# Patient Record
Sex: Male | Born: 1965 | Race: Black or African American | Hispanic: No | Marital: Married | Smoking: Never smoker
Health system: Southern US, Community
[De-identification: ages and names within clinical notes are randomized; demographics above are authoritative.]

## PROBLEM LIST (undated history)

## (undated) DIAGNOSIS — I4891 Unspecified atrial fibrillation: Secondary | ICD-10-CM

## (undated) DIAGNOSIS — M545 Low back pain, unspecified: Secondary | ICD-10-CM

## (undated) DIAGNOSIS — D573 Sickle-cell trait: Secondary | ICD-10-CM

## (undated) DIAGNOSIS — Z8619 Personal history of other infectious and parasitic diseases: Secondary | ICD-10-CM

## (undated) DIAGNOSIS — I499 Cardiac arrhythmia, unspecified: Secondary | ICD-10-CM

## (undated) DIAGNOSIS — R319 Hematuria, unspecified: Secondary | ICD-10-CM

## (undated) DIAGNOSIS — Z87442 Personal history of urinary calculi: Secondary | ICD-10-CM

## (undated) HISTORY — DX: Unspecified atrial fibrillation: I48.91

## (undated) HISTORY — DX: Low back pain: M54.5

## (undated) HISTORY — DX: Low back pain, unspecified: M54.50

## (undated) HISTORY — DX: Hematuria, unspecified: R31.9

## (undated) HISTORY — DX: Personal history of other infectious and parasitic diseases: Z86.19

---

## 1999-08-11 ENCOUNTER — Encounter: Admission: RE | Admit: 1999-08-11 | Discharge: 1999-08-11 | Payer: Self-pay | Admitting: Family Medicine

## 1999-08-11 ENCOUNTER — Encounter: Payer: Self-pay | Admitting: Family Medicine

## 2004-02-10 ENCOUNTER — Ambulatory Visit: Payer: Self-pay | Admitting: Family Medicine

## 2004-04-22 ENCOUNTER — Ambulatory Visit: Payer: Self-pay | Admitting: Internal Medicine

## 2004-06-15 ENCOUNTER — Ambulatory Visit: Payer: Self-pay | Admitting: Internal Medicine

## 2004-06-22 ENCOUNTER — Ambulatory Visit: Payer: Self-pay | Admitting: Internal Medicine

## 2004-07-22 ENCOUNTER — Ambulatory Visit: Payer: Self-pay | Admitting: Internal Medicine

## 2004-11-04 ENCOUNTER — Ambulatory Visit: Payer: Self-pay | Admitting: Internal Medicine

## 2005-09-07 ENCOUNTER — Ambulatory Visit: Payer: Self-pay | Admitting: Internal Medicine

## 2005-09-07 ENCOUNTER — Observation Stay (HOSPITAL_COMMUNITY): Admission: EM | Admit: 2005-09-07 | Discharge: 2005-09-08 | Payer: Self-pay | Admitting: Internal Medicine

## 2005-09-08 ENCOUNTER — Ambulatory Visit: Payer: Self-pay | Admitting: Internal Medicine

## 2005-09-15 ENCOUNTER — Ambulatory Visit: Payer: Self-pay | Admitting: Internal Medicine

## 2005-09-21 ENCOUNTER — Ambulatory Visit: Payer: Self-pay | Admitting: Internal Medicine

## 2005-11-24 ENCOUNTER — Ambulatory Visit: Payer: Self-pay | Admitting: Internal Medicine

## 2005-12-03 ENCOUNTER — Ambulatory Visit: Payer: Self-pay | Admitting: Internal Medicine

## 2006-04-25 ENCOUNTER — Ambulatory Visit: Payer: Self-pay | Admitting: Internal Medicine

## 2006-04-25 LAB — CONVERTED CEMR LAB
ALT: 70 units/L — ABNORMAL HIGH (ref 0–40)
AST: 48 units/L — ABNORMAL HIGH (ref 0–37)
Albumin: 3.8 g/dL (ref 3.5–5.2)
Alkaline Phosphatase: 78 units/L (ref 39–117)
BUN: 11 mg/dL (ref 6–23)
Basophils Absolute: 0 10*3/uL (ref 0.0–0.1)
Basophils Relative: 0.3 % (ref 0.0–1.0)
Bilirubin, Direct: 0.1 mg/dL (ref 0.0–0.3)
CO2: 29 meq/L (ref 19–32)
Calcium: 9.5 mg/dL (ref 8.4–10.5)
Chloride: 108 meq/L (ref 96–112)
Creatinine, Ser: 1.2 mg/dL (ref 0.4–1.5)
Eosinophils Absolute: 0 10*3/uL (ref 0.0–0.6)
Eosinophils Relative: 0.6 % (ref 0.0–5.0)
GFR calc Af Amer: 87 mL/min
GFR calc non Af Amer: 72 mL/min
Glucose, Bld: 94 mg/dL (ref 70–99)
HCT: 46 % (ref 39.0–52.0)
HCV Ab: NEGATIVE
Hemoglobin: 15.7 g/dL (ref 13.0–17.0)
Hep B C IgM: NEGATIVE
Hep B Core Total Ab: NEGATIVE
Hep B S Ab: NEGATIVE
Hepatitis B Surface Ag: NEGATIVE
Lymphocytes Relative: 18 % (ref 12.0–46.0)
MCHC: 34.2 g/dL (ref 30.0–36.0)
MCV: 81.5 fL (ref 78.0–100.0)
Monocytes Absolute: 0.6 10*3/uL (ref 0.2–0.7)
Monocytes Relative: 8.3 % (ref 3.0–11.0)
Neutro Abs: 5.6 10*3/uL (ref 1.4–7.7)
Neutrophils Relative %: 72.8 % (ref 43.0–77.0)
Platelets: 215 10*3/uL (ref 150–400)
Potassium: 4.1 meq/L (ref 3.5–5.1)
RBC: 5.64 M/uL (ref 4.22–5.81)
RDW: 13.3 % (ref 11.5–14.6)
Sodium: 144 meq/L (ref 135–145)
TSH: 1.54 microintl units/mL (ref 0.35–5.50)
Total Bilirubin: 0.7 mg/dL (ref 0.3–1.2)
Total Protein: 6.9 g/dL (ref 6.0–8.3)
WBC: 7.6 10*3/uL (ref 4.5–10.5)

## 2006-05-26 ENCOUNTER — Ambulatory Visit: Payer: Self-pay | Admitting: Internal Medicine

## 2006-12-05 DIAGNOSIS — R42 Dizziness and giddiness: Secondary | ICD-10-CM

## 2007-09-20 ENCOUNTER — Ambulatory Visit: Payer: Self-pay | Admitting: Internal Medicine

## 2007-09-20 LAB — CONVERTED CEMR LAB
ALT: 34 units/L (ref 0–53)
AST: 25 units/L (ref 0–37)
Albumin: 4.2 g/dL (ref 3.5–5.2)
Alkaline Phosphatase: 66 units/L (ref 39–117)
BUN: 13 mg/dL (ref 6–23)
Basophils Absolute: 0 10*3/uL (ref 0.0–0.1)
Basophils Relative: 0 % (ref 0–1)
CO2: 22 meq/L (ref 19–32)
Calcium: 9.5 mg/dL (ref 8.4–10.5)
Chloride: 106 meq/L (ref 96–112)
Cholesterol: 246 mg/dL — ABNORMAL HIGH (ref 0–200)
Creatinine, Ser: 1.15 mg/dL (ref 0.40–1.50)
Eosinophils Absolute: 0.1 10*3/uL (ref 0.0–0.7)
Eosinophils Relative: 1 % (ref 0–5)
Glucose, Bld: 90 mg/dL (ref 70–99)
HCT: 49.4 % (ref 39.0–52.0)
HDL: 56 mg/dL (ref >39)
Hemoglobin: 17.1 g/dL — ABNORMAL HIGH (ref 13.0–17.0)
LDL Cholesterol: 163 mg/dL — ABNORMAL HIGH (ref 0–99)
Lymphocytes Relative: 30 % (ref 12–46)
Lymphs Abs: 2.4 10*3/uL (ref 0.7–4.0)
MCHC: 34.6 g/dL (ref 30.0–36.0)
MCV: 78.3 fL (ref 78.0–100.0)
Monocytes Absolute: 0.7 10*3/uL (ref 0.1–1.0)
Monocytes Relative: 9 % (ref 3–12)
Neutro Abs: 5 10*3/uL (ref 1.7–7.7)
Neutrophils Relative %: 61 % (ref 43–77)
Platelets: 206 10*3/uL (ref 150–400)
Potassium: 4.1 meq/L (ref 3.5–5.3)
RBC: 6.31 M/uL — ABNORMAL HIGH (ref 4.22–5.81)
RDW: 14.7 % (ref 11.5–15.5)
Sodium: 141 meq/L (ref 135–145)
Total Bilirubin: 0.8 mg/dL (ref 0.3–1.2)
Total CHOL/HDL Ratio: 4.4
Total Protein: 7.2 g/dL (ref 6.0–8.3)
Triglycerides: 133 mg/dL (ref <150)
VLDL: 27 mg/dL (ref 0–40)
WBC: 8.2 10*3/uL (ref 4.0–10.5)

## 2007-09-23 ENCOUNTER — Ambulatory Visit: Payer: Self-pay | Admitting: Internal Medicine

## 2008-02-03 ENCOUNTER — Emergency Department (HOSPITAL_COMMUNITY): Admission: EM | Admit: 2008-02-03 | Discharge: 2008-02-04 | Payer: Self-pay | Admitting: Emergency Medicine

## 2010-07-22 ENCOUNTER — Encounter (INDEPENDENT_AMBULATORY_CARE_PROVIDER_SITE_OTHER): Payer: Self-pay | Admitting: Surgery

## 2010-07-24 NOTE — Discharge Summary (Signed)
NAMEOKEY, ZELEK NO.:  [id number]   MEDICAL RECORD NO.:  [id number]          PATIENT TYPE:  INP   LOCATION:  4736                         FACILITY:  [facility]   PHYSICIAN:  Gordy Savers, M.D. LHCDATE OF BIRTH:  [birth year]   DATE OF ADMISSION:  09/07/2005  DATE OF DISCHARGE:  09/08/2005                                 DISCHARGE SUMMARY   CHIEF COMPLAINT:  Chest pain.   HISTORY OF PRESENT ILLNESS:  The patient is aa 45 year old black gentleman  who presented to the office on the day of admission complaining of some  slight dyspnea and chest fullness.  An EKG was performed as well as an  initial set of cardiac enzymes.  The patient was sent home but later  readmitted when the total CK was 274.  CK-MB fraction was also slightly  elevated at 6.8.  D-dimer was negative.   The patient was subsequently admitted to the hospital for further  evaluation.  Followup EKG on the second hospital day revealed persistent  sinus bradycardia with inferior ST-T wave changes which were essentially  unchanged.  Serial cardiac markers were negative.   Over the brief hospital period of observation the patient remained  asymptomatic.  A chest x-ray was obtained that revealed some bronchitic  changes but no other acute disease.  At the time of discharge the patient  was pain free.  Chest was clear.  Cardiovascular exam was normal except for  some slight bradycardia.   During the hospital period, he was treated with Lovenox and placed on  aspirin.   DISPOSITION:  The patient will be discharged today on aspirin 325 mg daily.  He will be notified tomorrow and be set up for a nuclear medicine stress  test to exclude any coronary artery disease.  This seems low likelihood in a  patient without significant cardiac risk factors.  He will report any  recurrent chest pain.   CONDITION ON DISCHARGE:  Stable.           ______________________________  Gordy Savers, M.D.  L[facility]     PFK/MEDQ  D:  09/08/2005  T:  09/08/2005  Job:  [id number] [id number]

## 2010-07-24 NOTE — H&P (Signed)
Juan Mcintyre, Juan Mcintyre               ACCOUNT NO.:  [id number]   MEDICAL RECORD NO.:  [id number]          PATIENT TYPE:  EMS   LOCATION:  MAJO                         FACILITY:  [facility]   PHYSICIAN:  Juan Mole. Swords, MD    DATE OF BIRTH:  March 02, 1966   DATE OF ADMISSION:  09/07/2005  DATE OF DISCHARGE:                                HISTORY & PHYSICAL   CHIEF COMPLAINT:  Chest pain.   HISTORY OF PRESENT ILLNESS:  Mr. Juan Mcintyre is a 45 year old male who is  generally healthy.  He developed substernal chest discomfort associated with  a tight breathing sensation.  It lasted several hours, resolved  spontaneously, and then recurred today.  Symptoms now resolved.  He denies  any dyspnea on exertion, denies nausea or vomiting.  No diaphoresis.  He  describes his discomfort as a deep ache with intensity of 3/10. No cough and  no other complaints.  He denies that the discomfort was exertional, and he  has been able to exercise without symptoms.   PAST MEDICAL HISTORY:  Unremarkable.   CURRENT MEDICATIONS:  None.   SOCIAL HISTORY:  He works as a Curator.  He denies any recent unusual  activity, no travel.  He is married.  He is a nonsmoker.   FAMILY HISTORY:  Noncontributory.  Parents are alive and well without any  heart disease.  He has no cardiac risk factors.   REVIEW OF SYSTEMS:  He denies any constitutional, eye, ENT, mouth,  respiratory, GI, GU, musculoskeletal, skin, neurologic, psychiatric,  endocrine, hematology, lymphatics, allergy complaints other than those  stated above.  He specifically denies any heartburn.   PHYSICAL EXAMINATION:  VITAL SIGNS: Weight 198.  Temperature 97.5, pulse 64,  respirations 14, blood pressure 104/68.  GENERAL:  Well-developed, well-nourished male in no acute distress.  HEENT:  Atraumatic and normocephalic.  Extraocular muscles are intact.  NECK: Supple without lymphadenopathy, thyromegaly, jugular venous  distention.  CHEST: Clear to auscultation  without any increased work of breathing.  CARDIAC:  S1, S2 normal without murmurs, rubs, or gallops.  ABDOMEN: Active bowel sounds, soft, nontender, with no hepatosplenomegaly.  MUSCULOSKELETAL: Gait is normal.  He does have tenderness over the sternum  to palpation.  NEUROLOGIC:  Alert and oriented.   ASSESSMENT AND PLAN:  Chest pain, unclear etiology.   I suspect this is musculoskeletal chest pain given tenderness. His  description is somewhat different.  I will check an EKG which demonstrated  no acute findings.  We will check a D-dimer and CK with MB.  Of note, CK and  MB came back as minimally abnormal with total CK of 274 and MB of 6.8.   I think he needs admission based on that in the story.  He is admitted to  the hospital.  We will rule out myocardial infarction with serial enzymes,  perform and EKG and chest x-ray.  He may need outpatient stress testing,  perhaps Myoview.   We will treat with Lovenox and metoprolol empirically and add aspirin.      [name], MD  Electronically Signed  BHS/MEDQ  D:  09/07/2005  T:  09/07/2005  Job:  [id number]

## 2010-08-08 ENCOUNTER — Emergency Department (HOSPITAL_COMMUNITY): Payer: BC Managed Care – PPO

## 2010-08-08 ENCOUNTER — Emergency Department (HOSPITAL_COMMUNITY)
Admission: EM | Admit: 2010-08-08 | Discharge: 2010-08-08 | Disposition: A | Discharge status: Home / Self Care | Payer: BC Managed Care – PPO | Attending: Emergency Medicine | Admitting: Emergency Medicine

## 2010-08-08 DIAGNOSIS — I498 Other specified cardiac arrhythmias: Secondary | ICD-10-CM | POA: Insufficient documentation

## 2010-08-08 DIAGNOSIS — R0789 Other chest pain: Secondary | ICD-10-CM | POA: Insufficient documentation

## 2010-12-08 LAB — URINALYSIS, ROUTINE W REFLEX MICROSCOPIC
Bilirubin Urine: NEGATIVE
Glucose, UA: NEGATIVE
Hgb urine dipstick: NEGATIVE
Ketones, ur: NEGATIVE
Nitrite: NEGATIVE
Protein, ur: NEGATIVE
Specific Gravity, Urine: 1.024
Urobilinogen, UA: 0.2
pH: 5.5

## 2010-12-08 LAB — GLUCOSE, CAPILLARY

## 2010-12-08 LAB — GC/CHLAMYDIA PROBE AMP, URINE: Chlamydia, Swab/Urine, PCR: NEGATIVE

## 2010-12-08 LAB — URINE CULTURE: Culture: NO GROWTH

## 2011-02-20 ENCOUNTER — Emergency Department (HOSPITAL_COMMUNITY)
Admission: EM | Admit: 2011-02-20 | Discharge: 2011-02-20 | Disposition: A | Discharge status: Home / Self Care | Payer: Self-pay | Attending: Emergency Medicine | Admitting: Emergency Medicine

## 2011-02-20 ENCOUNTER — Encounter (HOSPITAL_COMMUNITY): Payer: Self-pay

## 2011-02-20 ENCOUNTER — Emergency Department (HOSPITAL_COMMUNITY): Payer: Self-pay

## 2011-02-20 DIAGNOSIS — R42 Dizziness and giddiness: Secondary | ICD-10-CM | POA: Insufficient documentation

## 2011-02-20 DIAGNOSIS — M129 Arthropathy, unspecified: Secondary | ICD-10-CM | POA: Insufficient documentation

## 2011-02-20 DIAGNOSIS — R110 Nausea: Secondary | ICD-10-CM | POA: Insufficient documentation

## 2011-02-20 DIAGNOSIS — Z7982 Long term (current) use of aspirin: Secondary | ICD-10-CM | POA: Insufficient documentation

## 2011-02-20 DIAGNOSIS — R51 Headache: Secondary | ICD-10-CM | POA: Insufficient documentation

## 2011-02-20 MED ORDER — MECLIZINE HCL 50 MG PO TABS: 50.0000 mg | ORAL_TABLET | Freq: Three times a day (TID) | ORAL | Status: AC | PRN

## 2011-02-20 MED ORDER — MECLIZINE HCL 25 MG PO TABS
25.0000 mg | ORAL_TABLET | Freq: Once | ORAL | Status: AC
  Administered 2011-02-20: 25 mg via ORAL
  Filled 2011-02-20: qty 1

## 2011-02-20 NOTE — ED Notes (Signed)
At 04:30 am pt.felt a pop in his head, and developed a headache, nausea, dizziness

## 2011-02-20 NOTE — ED Provider Notes (Signed)
Medical screening examination/treatment/procedure(s) were conducted as a shared visit with non-physician practitioner(s) and myself.  I personally evaluated the patient during the encounter.  Seen by me .  No neuro def.  Ct head neg  Donnetta Hutching, MD 02/20/11 [phone]

## 2011-02-20 NOTE — ED Provider Notes (Signed)
History     CSN: [id number] Arrival date & time: 02/20/2011  3:14 PM   First MD Initiated Contact with Patient 02/20/11 1553     Patient states while lying in bed at 4am he felt a sudden "pop" in his head associated with dizziness and nausea. He describes dizziness as his surroundings spinning. Symptoms are resolved at present. Patient states "I just don't feel right," but cannot describe further. Denies significant past medical hx. Denies hx of migraines or trauma. Denies constitutional sx.  States he takes ASA 81mg  daily for "thick blood."   Patient is a 45 y.o. male presenting with headaches. The history is provided by the patient.  Headache  This is a new problem. The current episode started 6 to 12 hours ago. The problem occurs every few hours. The problem has been resolved. The headache is associated with nothing. The patient is experiencing no pain. The pain does not radiate. Associated symptoms include nausea. Pertinent negatives include no anorexia, no fever, no malaise/fatigue, no near-syncope, no orthopnea, no palpitations, no syncope, no shortness of breath and no vomiting. He has tried nothing for the symptoms.    Past Medical History  Diagnosis Date  . Arthritis     History reviewed. No pertinent past surgical history.  History reviewed. No pertinent family history.  History  Substance Use Topics  . Smoking status: Never Smoker   . Smokeless tobacco: Not on file  . Alcohol Use: No      Review of Systems  Constitutional: Negative for fever, chills, malaise/fatigue, diaphoresis, activity change, appetite change and fatigue.  HENT: Negative for hearing loss, ear pain, congestion, facial swelling, rhinorrhea, sneezing, neck pain, neck stiffness, postnasal drip, tinnitus and ear discharge.   Eyes: Negative.   Respiratory: Negative for cough and shortness of breath.   Cardiovascular: Negative for chest pain, palpitations, orthopnea, leg swelling, syncope and  near-syncope.  Gastrointestinal: Positive for nausea. Negative for vomiting and anorexia.  Musculoskeletal: Negative for back pain and gait problem.  Neurological: Positive for dizziness. Negative for tremors, seizures, syncope, facial asymmetry, speech difficulty, weakness, light-headedness, numbness and headaches.  Hematological: Does not bruise/bleed easily.  Psychiatric/Behavioral: Negative.   All other systems reviewed and are negative.    Allergies  Review of patient's allergies indicates no known allergies.  Home Medications   Current Outpatient Rx  Name Route Sig Dispense Refill  . ADULT MULTIVITAMIN W/MINERALS CH Oral Take 1 tablet by mouth daily.        BP 136/87  Pulse 68  Temp(Src) 98.7 F (37.1 C) (Oral)  Resp 20  Ht 6\' 2"  (1.88 m)  Wt 220 lb (99.791 kg)  BMI 28.25 kg/m2  SpO2 98%  Physical Exam  Constitutional: He is oriented to person, place, and time. He appears well-developed and well-nourished. No distress.       Talking on cellphone in NAD  HENT:  Head: Normocephalic and atraumatic.  Right Ear: External ear normal.  Left Ear: External ear normal.  Nose: Nose normal.  Mouth/Throat: Oropharynx is clear and moist. No oropharyngeal exudate.       TM normal BL.  Eyes: Conjunctivae and EOM are normal. Pupils are equal, round, and reactive to light. Right eye exhibits no nystagmus. Left eye exhibits no nystagmus.  Neck: Normal range of motion. Neck supple.       No meningeal signs  Cardiovascular: Normal rate, regular rhythm, normal heart sounds and intact distal pulses.  Exam reveals no gallop and no friction rub.  No murmur heard. Pulmonary/Chest: Effort normal and breath sounds normal.  Abdominal: Soft. There is no tenderness.  Musculoskeletal: Normal range of motion. He exhibits no edema and no tenderness.  Neurological: He is alert and oriented to person, place, and time. He has normal strength and normal reflexes. He is not disoriented. He displays  no atrophy and no tremor. No cranial nerve deficit or sensory deficit. He exhibits normal muscle tone. He displays a negative Romberg sign. He displays no seizure activity. Coordination and gait normal. GCS eye subscore is 4. GCS verbal subscore is 5. GCS motor subscore is 6.       Negative HINTS exam  Skin: Skin is warm and dry. He is not diaphoretic.    ED Course  Procedures (including critical care time)  5:03 PM: Patient denies active symptoms on arrival to room - no HA, no dizziness or nausea at this time. Dizziness is described as movement of his surroundings. Neuro exam is intact. No trauma. No URI sx. No meningeal signs. Will d/w Dr. Adriana Simas. On review of records, patient has carried diagnosis of vertigo since at least 2008.  6:35 PM: Patient feels improved with meclizine. CT scan reassuring, repeat neuro exam reassuring. Doubt posterior circulation stroke. D/w Dr. Adriana Simas - d/c home with meclizine and PMD follow up. Patient agrees with plan.   Labs Reviewed - No data to display Ct Head Wo Contrast  02/20/2011  *RADIOLOGY REPORT*  Clinical Data: Head pain.  Pop in the head this morning.  Headache and dizziness.  CT HEAD WITHOUT CONTRAST  Technique:  Contiguous axial images were obtained from the base of the skull through the vertex without contrast.  Comparison: None.  Findings: There is no intra or extra-axial fluid collection or mass lesion.  The basilar cisterns and ventricles have a normal appearance.  There is no CT evidence for acute infarction or hemorrhage.  Bone windows show no acute finding.  IMPRESSION: Negative exam.  Original Report Authenticated By: Patterson Hammersmith, M.D.     No diagnosis found. Vertigo   MDM  Patient describes vertiginous symptoms. Reassuring neuro exam and imaging. Improved in dept. With meclizine. PMD follow up recommended. Patient verbalized understanding of reasons to return.      Marcell Anger, [place] 02/20/11 [phone]

## 2011-07-07 HISTORY — PX: OTHER SURGICAL HISTORY: SHX169

## 2012-08-02 ENCOUNTER — Encounter (HOSPITAL_COMMUNITY): Payer: Self-pay | Admitting: Emergency Medicine

## 2012-08-02 ENCOUNTER — Emergency Department (HOSPITAL_COMMUNITY): Payer: BC Managed Care – PPO

## 2012-08-02 ENCOUNTER — Emergency Department (HOSPITAL_COMMUNITY)
Admission: EM | Admit: 2012-08-02 | Discharge: 2012-08-03 | Disposition: A | Discharge status: Home / Self Care | Payer: BC Managed Care – PPO | Attending: Emergency Medicine | Admitting: Emergency Medicine

## 2012-08-02 DIAGNOSIS — Z7982 Long term (current) use of aspirin: Secondary | ICD-10-CM | POA: Insufficient documentation

## 2012-08-02 DIAGNOSIS — Z79899 Other long term (current) drug therapy: Secondary | ICD-10-CM | POA: Insufficient documentation

## 2012-08-02 DIAGNOSIS — R42 Dizziness and giddiness: Secondary | ICD-10-CM | POA: Insufficient documentation

## 2012-08-02 DIAGNOSIS — Z8739 Personal history of other diseases of the musculoskeletal system and connective tissue: Secondary | ICD-10-CM | POA: Insufficient documentation

## 2012-08-02 DIAGNOSIS — R0789 Other chest pain: Secondary | ICD-10-CM

## 2012-08-02 LAB — BASIC METABOLIC PANEL
Chloride: 104 mEq/L (ref 96–112)
GFR calc Af Amer: 85 mL/min — ABNORMAL LOW (ref >90)
Potassium: 3.7 mEq/L (ref 3.5–5.1)

## 2012-08-02 LAB — CBC
HCT: 47 % (ref 39.0–52.0)
Platelets: 207 10*3/uL (ref 150–400)
RBC: 6.23 MIL/uL — ABNORMAL HIGH (ref 4.22–5.81)
RDW: 14.1 % (ref 11.5–15.5)
WBC: 9.2 10*3/uL (ref 4.0–10.5)

## 2012-08-02 LAB — PRO B NATRIURETIC PEPTIDE: Pro B Natriuretic peptide (BNP): <5 pg/mL (ref 0–125)

## 2012-08-02 LAB — POCT I-STAT TROPONIN I

## 2012-08-02 MED ORDER — NITROGLYCERIN 0.4 MG SL SUBL: 0.4000 mg | SUBLINGUAL_TABLET | SUBLINGUAL | Status: DC | PRN

## 2012-08-02 MED ORDER — ASPIRIN 325 MG PO TABS: 325.0000 mg | ORAL_TABLET | ORAL | Status: DC

## 2012-08-02 MED ORDER — SODIUM CHLORIDE 0.9 % IV BOLUS (SEPSIS): 1000.0000 mL | Freq: Once | INTRAVENOUS | Status: DC

## 2012-08-02 NOTE — ED Notes (Signed)
PT. REPORTS INTERMITTENT LEFT CHEST PAIN ./ PALPITATIONS ONSET LAST NIGHT WITH SLIGHT SOB AND OCCASIONAL DRY COUGH , DENIES NAUSEA OR DIAPHORESIS , NO CHEST PAIN AT ARRIVAL.

## 2012-08-03 NOTE — ED Notes (Signed)
Pt states he refuses an IV and wants to drink water when he gets home. Pt told the significance of getting the IV. Pt states it takes too long and wants to go home

## 2012-08-03 NOTE — ED Provider Notes (Signed)
History     CSN: [id number]  Arrival date & time 08/02/12  2121   First MD Initiated Contact with Patient 08/02/12 2247      Chief Complaint  Patient presents with  . Chest Pain    (Consider location/radiation/quality/duration/timing/severity/associated sxs/prior treatment) HPI Patient presents emergency department with chest pain, dizziness, and weakness.  Patient, states, that he had chest pain, starting last night, and would last for 10 seconds at a time.  Patient denies shortness of breath, nausea, vomiting, diarrhea, abdominal pain, back pain, blurred vision, headache, syncope, or lightheadedness.  Patient, states, that he did not take anything prior to arrival for his symptoms.  Patient, states, that nothing seems to make his condition, better or worse.  Patient, states, that the  Weakness, and dizziness occurred while at work today.  Patient, states, that Past Medical History  Diagnosis Date  . Arthritis     History reviewed. No pertinent past surgical history.  No family history on file.  History  Substance Use Topics  . Smoking status: Never Smoker   . Smokeless tobacco: Not on file  . Alcohol Use: No      Review of Systems All other systems negative except as documented in the HPI. All pertinent positives and negatives as reviewed in the HPI. Allergies  Review of patient's allergies indicates no known allergies.  Home Medications   Current Outpatient Rx  Name  Route  Sig  Dispense  Refill  . aspirin 81 MG tablet   Oral   Take 81 mg by mouth daily.         . Multiple Vitamin (MULITIVITAMIN WITH MINERALS) TABS   Oral   Take 1 tablet by mouth daily.             BP 140/95  Pulse 64  Temp(Src) 98.9 F (37.2 C) (Oral)  Resp 21  SpO2 98%  Physical Exam  Nursing note and vitals reviewed. Constitutional: He is oriented to person, place, and time. He appears well-developed and well-nourished. No distress.  HENT:  Head: Normocephalic and atraumatic.   Mouth/Throat: Oropharynx is clear and moist.  Eyes: Pupils are equal, round, and reactive to light.  Neck: Normal range of motion. Neck supple.  Cardiovascular: Normal rate, regular rhythm and normal heart sounds.  Exam reveals no gallop and no friction rub.   No murmur heard. Pulmonary/Chest: Effort normal and breath sounds normal. No respiratory distress.  Abdominal: Soft. Bowel sounds are normal. He exhibits no distension. There is no tenderness.  Neurological: He is alert and oriented to person, place, and time.  Skin: Skin is warm and dry.    ED Course  Procedures (including critical care time)  Labs Reviewed  CBC - Abnormal; Notable for the following:    RBC 6.23 (*)    Hemoglobin 17.5 (*)    MCV 75.4 (*)    MCHC 37.2 (*)    All other components within normal limits  BASIC METABOLIC PANEL - Abnormal; Notable for the following:    Glucose, Bld 102 (*)    GFR calc non Af Amer 74 (*)    GFR calc Af Amer 85 (*)    All other components within normal limits  PRO B NATRIURETIC PEPTIDE  POCT I-STAT TROPONIN I   Dg Chest 2 View  08/02/2012   *RADIOLOGY REPORT*  Clinical Data: Chest pain and dizziness; mild hypertension.  CHEST - 2 VIEW  Comparison: Chest radiograph performed 08/08/2010  Findings: The lungs are well-aerated and clear.  There is no evidence of focal opacification, pleural effusion or pneumothorax.  The heart is normal in size; the mediastinal contour is within normal limits.  No acute osseous abnormalities are seen.  IMPRESSION: No acute cardiopulmonary process seen.   Original Report Authenticated By: Tonia Ghent, M.D.   Patient's chest pain, lasts for about 10 seconds at a time which is atypical for ACS.  Patient is PERC negative.I feel that the patient may have dehydration, and that's what causes, dizziness.  Patient does work out in the heat.patient states he like to go home at this time.  He is not having any current symptoms.  Patient is advised to return here  for any worsening in his condition.    MDM   MDM Reviewed: nursing note, vitals and previous chart Interpretation: labs, x-ray and ECG           Carlyle Dolly, PA-C 08/03/12 0049  Carlyle Dolly, PA-C 08/03/12 [phone]

## 2012-08-05 NOTE — ED Provider Notes (Signed)
Medical screening examination/treatment/procedure(s) were performed by non-physician practitioner and as supervising physician I was immediately available for consultation/collaboration.   Zurisadai Helminiak, MD 08/05/12 0018 

## 2013-06-27 ENCOUNTER — Other Ambulatory Visit: Payer: Self-pay | Admitting: Family Medicine

## 2013-06-27 DIAGNOSIS — R319 Hematuria, unspecified: Secondary | ICD-10-CM

## 2013-06-27 DIAGNOSIS — M549 Dorsalgia, unspecified: Secondary | ICD-10-CM

## 2013-07-03 ENCOUNTER — Ambulatory Visit
Admission: RE | Admit: 2013-07-03 | Discharge: 2013-07-03 | Disposition: A | Discharge status: Home / Self Care | Payer: BC Managed Care – PPO | Source: Ambulatory Visit | Attending: Family Medicine | Admitting: Family Medicine

## 2013-07-03 DIAGNOSIS — R319 Hematuria, unspecified: Secondary | ICD-10-CM

## 2013-07-03 DIAGNOSIS — M549 Dorsalgia, unspecified: Secondary | ICD-10-CM

## 2013-07-09 ENCOUNTER — Other Ambulatory Visit: Payer: Self-pay | Admitting: Family Medicine

## 2013-07-09 DIAGNOSIS — R918 Other nonspecific abnormal finding of lung field: Secondary | ICD-10-CM

## 2013-07-11 ENCOUNTER — Ambulatory Visit
Admission: RE | Admit: 2013-07-11 | Discharge: 2013-07-11 | Disposition: A | Discharge status: Home / Self Care | Payer: BC Managed Care – PPO | Source: Ambulatory Visit | Attending: Family Medicine | Admitting: Family Medicine

## 2013-07-11 DIAGNOSIS — R918 Other nonspecific abnormal finding of lung field: Secondary | ICD-10-CM

## 2013-07-13 ENCOUNTER — Other Ambulatory Visit: Payer: Self-pay | Admitting: Family Medicine

## 2013-07-13 DIAGNOSIS — R160 Hepatomegaly, not elsewhere classified: Secondary | ICD-10-CM

## 2013-07-25 ENCOUNTER — Other Ambulatory Visit: Payer: Self-pay | Admitting: Family Medicine

## 2013-07-25 DIAGNOSIS — R160 Hepatomegaly, not elsewhere classified: Secondary | ICD-10-CM

## 2013-08-02 ENCOUNTER — Ambulatory Visit
Admission: RE | Admit: 2013-08-02 | Discharge: 2013-08-02 | Disposition: A | Discharge status: Home / Self Care | Payer: BC Managed Care – PPO | Source: Ambulatory Visit | Attending: Family Medicine | Admitting: Family Medicine

## 2013-08-02 DIAGNOSIS — R160 Hepatomegaly, not elsewhere classified: Secondary | ICD-10-CM

## 2013-08-02 MED ORDER — GADOBENATE DIMEGLUMINE 529 MG/ML IV SOLN
19.0000 mL | Freq: Once | INTRAVENOUS | Status: AC | PRN
  Administered 2013-08-02: 19 mL via INTRAVENOUS

## 2014-06-17 ENCOUNTER — Other Ambulatory Visit: Payer: Self-pay | Admitting: Internal Medicine

## 2014-06-17 DIAGNOSIS — R911 Solitary pulmonary nodule: Secondary | ICD-10-CM

## 2014-06-28 ENCOUNTER — Ambulatory Visit
Admission: RE | Admit: 2014-06-28 | Discharge: 2014-06-28 | Disposition: A | Discharge status: Home / Self Care | Payer: 59 | Source: Ambulatory Visit | Attending: Internal Medicine | Admitting: Internal Medicine

## 2014-06-28 DIAGNOSIS — R911 Solitary pulmonary nodule: Secondary | ICD-10-CM

## 2014-12-06 ENCOUNTER — Emergency Department (HOSPITAL_COMMUNITY): Payer: 59 | Admitting: Anesthesiology

## 2014-12-06 ENCOUNTER — Encounter (HOSPITAL_COMMUNITY): Payer: Self-pay | Admitting: Emergency Medicine

## 2014-12-06 ENCOUNTER — Encounter (HOSPITAL_COMMUNITY)
Admission: EM | Disposition: A | Discharge status: Home / Self Care | Payer: Self-pay | Source: Home / Self Care | Attending: Emergency Medicine

## 2014-12-06 ENCOUNTER — Ambulatory Visit (HOSPITAL_COMMUNITY)
Admission: EM | Admit: 2014-12-06 | Discharge: 2014-12-07 | Disposition: A | Discharge status: Home / Self Care | Payer: 59 | Attending: Urology | Admitting: Urology

## 2014-12-06 ENCOUNTER — Ambulatory Visit: Admit: 2014-12-06 | Payer: Self-pay | Admitting: Urology

## 2014-12-06 DIAGNOSIS — Y9389 Activity, other specified: Secondary | ICD-10-CM | POA: Insufficient documentation

## 2014-12-06 DIAGNOSIS — I4891 Unspecified atrial fibrillation: Secondary | ICD-10-CM | POA: Insufficient documentation

## 2014-12-06 DIAGNOSIS — S39840A Fracture of corpus cavernosum penis, initial encounter: Secondary | ICD-10-CM | POA: Diagnosis not present

## 2014-12-06 DIAGNOSIS — N4889 Other specified disorders of penis: Secondary | ICD-10-CM | POA: Diagnosis present

## 2014-12-06 DIAGNOSIS — Y9289 Other specified places as the place of occurrence of the external cause: Secondary | ICD-10-CM | POA: Diagnosis not present

## 2014-12-06 DIAGNOSIS — X58XXXA Exposure to other specified factors, initial encounter: Secondary | ICD-10-CM | POA: Insufficient documentation

## 2014-12-06 DIAGNOSIS — D573 Sickle-cell trait: Secondary | ICD-10-CM | POA: Diagnosis not present

## 2014-12-06 DIAGNOSIS — Y998 Other external cause status: Secondary | ICD-10-CM | POA: Insufficient documentation

## 2014-12-06 HISTORY — PX: REPAIR OF FRACTURED PENIS: SHX6063

## 2014-12-06 HISTORY — DX: Sickle-cell trait: D57.3

## 2014-12-06 SURGERY — REPAIR, FRACTURE, PENIS
Anesthesia: General | Site: Penis

## 2014-12-06 MED ORDER — LIDOCAINE HCL (CARDIAC) 20 MG/ML IV SOLN
INTRAVENOUS | Status: DC | PRN
  Administered 2014-12-06: 50 mg via INTRAVENOUS

## 2014-12-06 MED ORDER — HYDROCODONE-ACETAMINOPHEN 5-325 MG PO TABS
1.0000 | ORAL_TABLET | ORAL | Status: DC | PRN
  Administered 2014-12-07: 2 via ORAL
  Filled 2014-12-06: qty 2

## 2014-12-06 MED ORDER — FLEET ENEMA 7-19 GM/118ML RE ENEM: 1.0000 | ENEMA | Freq: Once | RECTAL | Status: DC | PRN

## 2014-12-06 MED ORDER — HYDROMORPHONE HCL 1 MG/ML IJ SOLN
0.5000 mg | INTRAMUSCULAR | Status: DC | PRN
  Administered 2014-12-07: 1 mg via INTRAVENOUS
  Filled 2014-12-06 (×2): qty 1

## 2014-12-06 MED ORDER — METOCLOPRAMIDE HCL 5 MG/ML IJ SOLN
INTRAMUSCULAR | Status: DC | PRN
  Administered 2014-12-06: 10 mg via INTRAVENOUS

## 2014-12-06 MED ORDER — LACTATED RINGERS IV SOLN
INTRAVENOUS | Status: DC
  Administered 2014-12-06: 1000 mL via INTRAVENOUS

## 2014-12-06 MED ORDER — DOCUSATE SODIUM 100 MG PO CAPS
100.0000 mg | ORAL_CAPSULE | Freq: Two times a day (BID) | ORAL | Status: DC
  Administered 2014-12-06 – 2014-12-07 (×2): 100 mg via ORAL
  Filled 2014-12-06 (×2): qty 1

## 2014-12-06 MED ORDER — ACETAMINOPHEN 325 MG PO TABS: 650.0000 mg | ORAL_TABLET | ORAL | Status: DC | PRN

## 2014-12-06 MED ORDER — MIDAZOLAM HCL 2 MG/2ML IJ SOLN
INTRAMUSCULAR | Status: AC
  Filled 2014-12-06: qty 4

## 2014-12-06 MED ORDER — DIPHENHYDRAMINE HCL 50 MG/ML IJ SOLN: 12.5000 mg | Freq: Four times a day (QID) | INTRAMUSCULAR | Status: DC | PRN

## 2014-12-06 MED ORDER — BISACODYL 10 MG RE SUPP: 10.0000 mg | Freq: Every day | RECTAL | Status: DC | PRN

## 2014-12-06 MED ORDER — FENTANYL CITRATE (PF) 100 MCG/2ML IJ SOLN
INTRAMUSCULAR | Status: AC
  Filled 2014-12-06: qty 4

## 2014-12-06 MED ORDER — CEFAZOLIN SODIUM-DEXTROSE 2-3 GM-% IV SOLR
2.0000 g | Freq: Three times a day (TID) | INTRAVENOUS | Status: DC
  Administered 2014-12-06: 2 g via INTRAVENOUS
  Filled 2014-12-06 (×3): qty 50

## 2014-12-06 MED ORDER — PROPOFOL 10 MG/ML IV BOLUS
INTRAVENOUS | Status: DC | PRN
  Administered 2014-12-06: 200 mg via INTRAVENOUS

## 2014-12-06 MED ORDER — ONDANSETRON HCL 4 MG/2ML IJ SOLN: 4.0000 mg | INTRAMUSCULAR | Status: DC | PRN

## 2014-12-06 MED ORDER — 0.9 % SODIUM CHLORIDE (POUR BTL) OPTIME
TOPICAL | Status: DC | PRN
  Administered 2014-12-06: 1000 mL

## 2014-12-06 MED ORDER — HYDROMORPHONE HCL 2 MG/ML IJ SOLN
INTRAMUSCULAR | Status: AC
  Filled 2014-12-06: qty 1

## 2014-12-06 MED ORDER — MIDAZOLAM HCL 5 MG/5ML IJ SOLN
INTRAMUSCULAR | Status: DC | PRN
  Administered 2014-12-06: 2 mg via INTRAVENOUS

## 2014-12-06 MED ORDER — HYDROMORPHONE HCL 1 MG/ML IJ SOLN
INTRAMUSCULAR | Status: AC
  Administered 2014-12-07: 1 mg
  Filled 2014-12-06: qty 1

## 2014-12-06 MED ORDER — ONDANSETRON HCL 4 MG/2ML IJ SOLN
INTRAMUSCULAR | Status: DC | PRN
Start: 2014-12-06 — End: 2014-12-06
  Administered 2014-12-06: 4 mg via INTRAVENOUS

## 2014-12-06 MED ORDER — FENTANYL CITRATE (PF) 100 MCG/2ML IJ SOLN
INTRAMUSCULAR | Status: DC | PRN
  Administered 2014-12-06: 50 ug via INTRAVENOUS
  Administered 2014-12-06 (×6): 25 ug via INTRAVENOUS

## 2014-12-06 MED ORDER — SENNOSIDES-DOCUSATE SODIUM 8.6-50 MG PO TABS: 1.0000 | ORAL_TABLET | Freq: Every evening | ORAL | Status: DC | PRN

## 2014-12-06 MED ORDER — HYDROMORPHONE HCL 1 MG/ML IJ SOLN
INTRAMUSCULAR | Status: DC | PRN
  Administered 2014-12-06 (×2): 0.5 mg via INTRAVENOUS

## 2014-12-06 MED ORDER — PROPOFOL 10 MG/ML IV BOLUS
INTRAVENOUS | Status: AC
  Filled 2014-12-06: qty 20

## 2014-12-06 MED ORDER — DIPHENHYDRAMINE HCL 12.5 MG/5ML PO ELIX: 12.5000 mg | ORAL_SOLUTION | Freq: Four times a day (QID) | ORAL | Status: DC | PRN

## 2014-12-06 MED ORDER — HYDROMORPHONE HCL 1 MG/ML IJ SOLN
0.2500 mg | INTRAMUSCULAR | Status: DC | PRN
  Administered 2014-12-06: 0.5 mg via INTRAVENOUS

## 2014-12-06 MED ORDER — KCL IN DEXTROSE-NACL 20-5-0.45 MEQ/L-%-% IV SOLN
INTRAVENOUS | Status: DC
  Administered 2014-12-06: 22:00:00 via INTRAVENOUS
  Filled 2014-12-06 (×3): qty 1000

## 2014-12-06 MED ORDER — ZOLPIDEM TARTRATE 5 MG PO TABS: 5.0000 mg | ORAL_TABLET | Freq: Every evening | ORAL | Status: DC | PRN

## 2014-12-06 MED ORDER — HYOSCYAMINE SULFATE 0.125 MG SL SUBL
0.1250 mg | SUBLINGUAL_TABLET | SUBLINGUAL | Status: DC | PRN
  Filled 2014-12-06: qty 1

## 2014-12-06 SURGICAL SUPPLY — 40 items
BENZOIN TINCTURE PRP APPL 2/3 (GAUZE/BANDAGES/DRESSINGS) IMPLANT
BLADE HEX COATED 2.75 (ELECTRODE) IMPLANT
BLADE SURG 15 STRL LF DISP TIS (BLADE) ×1 IMPLANT
BLADE SURG 15 STRL SS (BLADE) ×2
BNDG GAUZE ELAST 4 BULKY (GAUZE/BANDAGES/DRESSINGS) ×3 IMPLANT
CATH SILICONE 16FRX5CC (CATHETERS) ×3 IMPLANT
CATH URET 5FR 28IN OPEN ENDED (CATHETERS) IMPLANT
COVER SURGICAL LIGHT HANDLE (MISCELLANEOUS) ×3 IMPLANT
DRAIN PENROSE 18X1/2 LTX STRL (DRAIN) IMPLANT
DRAIN PENROSE 18X1/4 LTX STRL (WOUND CARE) IMPLANT
DRAIN PENROSE LF 8X20.3CM SIL (WOUND CARE) ×3 IMPLANT
DRAPE PED LAPAROTOMY (DRAPES) ×3 IMPLANT
ELECT PENCIL ROCKER SW 15FT (MISCELLANEOUS) ×3 IMPLANT
ELECT REM PT RETURN 9FT ADLT (ELECTROSURGICAL) ×3
ELECTRODE REM PT RTRN 9FT ADLT (ELECTROSURGICAL) ×1 IMPLANT
GAUZE SPONGE 4X4 12PLY STRL (GAUZE/BANDAGES/DRESSINGS) ×3 IMPLANT
GLOVE SURG SS PI 8.0 STRL IVOR (GLOVE) IMPLANT
GOWN STRL REUS W/TWL XL LVL3 (GOWN DISPOSABLE) ×6 IMPLANT
KIT BASIN OR (CUSTOM PROCEDURE TRAY) ×3 IMPLANT
LIQUID BAND (GAUZE/BANDAGES/DRESSINGS) ×3 IMPLANT
NEEDLE HYPO 22GX1.5 SAFETY (NEEDLE) IMPLANT
NS IRRIG 1000ML POUR BTL (IV SOLUTION) ×3 IMPLANT
PACK BASIC VI WITH GOWN DISP (CUSTOM PROCEDURE TRAY) ×3 IMPLANT
PLUG CATH AND CAP STER (CATHETERS) ×3 IMPLANT
RETRACTOR WILSON SYSTEM (INSTRUMENTS) ×3 IMPLANT
SOL PREP PROV IODINE SCRUB 4OZ (MISCELLANEOUS) ×6 IMPLANT
SPONGE LAP 18X18 X RAY DECT (DISPOSABLE) ×3 IMPLANT
SPONGE LAP 4X18 X RAY DECT (DISPOSABLE) ×6 IMPLANT
SUPPORT SCROTAL LG STRP (MISCELLANEOUS) ×4 IMPLANT
SUPPORTER ATHLETIC LG (MISCELLANEOUS) ×2
SUT CHROMIC 3 0 SH 27 (SUTURE) ×6 IMPLANT
SUT CHROMIC 4 0 SH 27 (SUTURE) IMPLANT
SUT PDS AB 2-0 CT2 27 (SUTURE) ×6 IMPLANT
SUT VIC AB 3-0 SH 27 (SUTURE) ×4
SUT VIC AB 3-0 SH 27XBRD (SUTURE) ×2 IMPLANT
SUT VICRYL 0 TIES 12 18 (SUTURE) ×3 IMPLANT
SYR CONTROL 10ML LL (SYRINGE) ×6 IMPLANT
TRAY FOLEY BAG SILVER LF 16FR (SET/KITS/TRAYS/PACK) ×3 IMPLANT
TRAY FOLEY W/METER SILVER 16FR (SET/KITS/TRAYS/PACK) ×3 IMPLANT
WATER STERILE IRR 1500ML POUR (IV SOLUTION) IMPLANT

## 2014-12-06 NOTE — ED Notes (Signed)
Pt noted he started having swelling in his penis about an hour ago.  He stated he was masturbating and at the point of orgasm he noticed it twisted and had pain during ejaculation.  He went to his PCP first and they sent him here immediately for tx.

## 2014-12-06 NOTE — Discharge Instructions (Addendum)
Penile Fracture Fracture of the penis is an uncommon injury of the erect penis. This injury most often happens during forceful sexual intercourse, but it may happen under any circumstances when an erection occurs. This is not a fractured bone. The penis contains only soft and fibrous (tough leathery) tissue. The tough fibrous layers inside are the structures which fill up with blood during an erection (hardening of the penis). If a rupture or break of one of these layers occurs, it is called a penile fracture. There may also be injury to the urethra (the tube in the penis which carries the urine from the bladder). SYMPTOMS  This injury is usually noticed right away because of:  Pain.  A changed shape of the penis. DIAGNOSIS   The diagnosis can be made by your caregiver talking to you and learning how the injury happened.  The diagnosis can be made by examining you.  Specialized testing may sometimes be done to confirm a suspected diagnosis or to find out what damage needs to be repaired during surgery. These tests include:  Ultrasonography (imaging technique used to look inside the body).  Cavernosography (x-ray of the blood flow in the penis).  Urethrography (x-ray of the urethra). TREATMENT  When the problem is severe, it is considered a surgical emergency. The sooner the problem is repaired, the more likely there will be a good result. Emergency surgical repair offers the best chance of recovery with a correct working penis.  If conservative (nonsurgical) treatment is used, there may be other complications such as:  A bulging out of the side of the penis (aneurysm)  Scarring and hardening of the penis  Abnormal curvature  Erectile dysfunction (problems with impotence)  The final outcome will be unsatisfactory or poor. Minor cases of fractured penis may be treated conservatively. The decision to treat conservatively or with surgery should be made with your urologist to fully  understand the pros and cons of these different treatments. Document Released: 01/06/2004 Document Revised: 05/17/2011 Document Reviewed: 01/05/2008 [facility] Patient Information 2015 [place], [place]. This information is not intended to replace advice given to you by your health care provider. Make sure you discuss any questions you have with your health care provider.  Call for fever >101, difficulty with the catheter or heavy bleeding.    You can use an ice pack on the wound as needed.  Monday at 8am to have the catheter  removed.

## 2014-12-06 NOTE — Anesthesia Preprocedure Evaluation (Signed)
Anesthesia Evaluation  Patient identified by MRN, date of birth, ID band Patient awake    Reviewed: Allergy & Precautions, NPO status , Patient's Chart, lab work & pertinent test results  Airway Mallampati: II  TM Distance: >3 FB Neck ROM: Full    Dental no notable dental hx.    Pulmonary neg pulmonary ROS,    Pulmonary exam normal breath sounds clear to auscultation       Cardiovascular negative cardio ROS Normal cardiovascular exam Rhythm:Regular Rate:Normal     Neuro/Psych negative neurological ROS  negative psych ROS   GI/Hepatic negative GI ROS, Neg liver ROS,   Endo/Other  negative endocrine ROS  Renal/GU negative Renal ROS  negative genitourinary   Musculoskeletal negative musculoskeletal ROS (+)   Abdominal   Peds negative pediatric ROS (+)  Hematology negative hematology ROS (+)   Anesthesia Other Findings   Reproductive/Obstetrics negative OB ROS                             Anesthesia Physical Anesthesia Plan  ASA: I  Anesthesia Plan: General   Post-op Pain Management:    Induction: Intravenous  Airway Management Planned: LMA  Additional Equipment:   Intra-op Plan:   Post-operative Plan: Extubation in OR  Informed Consent: I have reviewed the patients History and Physical, chart, labs and discussed the procedure including the risks, benefits and alternatives for the proposed anesthesia with the patient or authorized representative who has indicated his/her understanding and acceptance.   Dental advisory given  Plan Discussed with: CRNA  Anesthesia Plan Comments:         Anesthesia Quick Evaluation  

## 2014-12-06 NOTE — Transfer of Care (Signed)
Immediate Anesthesia Transfer of Care Note  Patient: Juan Mcintyre  Procedure(s) Performed: Procedure(s): REPAIR OF FRACTURED PENIS (N/A)  Patient Location: PACU  Anesthesia Type:General  Level of Consciousness:  sedated, patient cooperative and responds to stimulation  Airway & Oxygen Therapy:Patient Spontanous Breathing and Patient connected to face mask oxgen  Post-op Assessment:  Report given to PACU RN and Post -op Vital signs reviewed and stable  Post vital signs:  Reviewed and stable  Last Vitals:  Filed Vitals:   12/06/14 1602  BP: 140/89  Pulse: 61  Temp:   Resp: 18    Complications: No apparent anesthesia complications

## 2014-12-06 NOTE — Anesthesia Postprocedure Evaluation (Signed)
  Anesthesia Post-op Note  Patient: Juan Mcintyre  Procedure(s) Performed: Procedure(s) (LRB): REPAIR OF FRACTURED PENIS (N/A)  Patient Location: PACU  Anesthesia Type: General  Level of Consciousness: awake and alert   Airway and Oxygen Therapy: Patient Spontanous Breathing  Post-op Pain: mild  Post-op Assessment: Post-op Vital signs reviewed, Patient's Cardiovascular Status Stable, Respiratory Function Stable, Patent Airway and No signs of Nausea or vomiting  Last Vitals:  Filed Vitals:   12/06/14 2200  BP: 134/85  Pulse: 48  Temp: 36.6 C  Resp: 18    Post-op Vital Signs: stable   Complications: No apparent anesthesia complications

## 2014-12-06 NOTE — ED Notes (Signed)
Clothing (shirt, underwear, socks, boots, pants, belt) sent with patient in bags to OR  Cellphone, contacts, phone charger, wallet (4 Visa, ID, misc cards, no cash), keys All given to security and logged on paper.

## 2014-12-06 NOTE — ED Provider Notes (Signed)
49 year old otherwise healthy male who while masturbating today felt acute onset of pain and deformity of his Penis.  He has not been able to urinate - has on exam a very swollen penile shaft, the foreskin is able to be retracted adequately, no urethral drainage discharge or blood, there is some bruising evident at the base of the penis. Scrotum otherwise nontender, bilateral testicles descended and palpable, no obvious masses in the scrotum other than diffuse scrotal swelling.  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  I personally evaluated the patient during the encounter.  Clinical Impression:   Final diagnoses:  Penile swelling         Eber Hong, MD 12/06/14 1539

## 2014-12-06 NOTE — ED Notes (Signed)
Pt voided 300 ml urine

## 2014-12-06 NOTE — H&P (Signed)
Subjective: Juan Mcintyre is a 49 yo male who I was asked to see in consultation by Dr. Hyacinth Meeker for a possible penile fracture.  He was masturbating this morning.  He torqued the penis to the left and felt a pop and lost the erection.  He than began to have massive swelling and pain.  He has had no hematuria and has been able to void.   He has no prior GU history.  ROS:  Review of Systems  Genitourinary:       See history.  All other systems reviewed and are negative.   No Known Allergies  Past Medical History  Diagnosis Date  . History of Helicobacter pylori infection   . Acute low back pain   . Hematuria   . A-fib   . Sickle cell trait     Past Surgical History  Procedure Laterality Date  . Other surgical history   07/2011    endo    Social History   Social History  . Marital Status: Married    Spouse Name: N/A  . Number of Children: N/A  . Years of Education: N/A   Occupational History  . Not on file.   Social History Main Topics  . Smoking status: Never Smoker   . Smokeless tobacco: Not on file  . Alcohol Use: No  . Drug Use: No  . Sexual Activity: Not on file   Other Topics Concern  . Not on file   Social History Narrative    Family History  Problem Relation Age of Onset  . Hypertension Mother   . Alzheimer's disease Father     Anti-infectives: Anti-infectives    None      No current facility-administered medications for this encounter.   No current outpatient prescriptions on file.     Objective: Vital signs in last 24 hours: Temp:  [99 F (37.2 C)] 99 F (37.2 C) (09/30 1026) Pulse Rate:  [93] 93 (09/30 1026) Resp:  [16] 16 (09/30 1026) BP: (147)/(98) 147/98 mmHg (09/30 1026) SpO2:  [97 %] 97 % (09/30 1026) Weight:  [97.977 kg (216 lb)] 97.977 kg (216 lb) (09/30 1026)  Intake/Output from previous day:   Intake/Output this shift:     Physical Exam  Constitutional: He is oriented to person, place, and time and well-developed,  well-nourished, and in no distress.  HENT:  Head: Normocephalic and atraumatic.  Neck: Normal range of motion. Neck supple.  Cardiovascular: Normal rate and regular rhythm.   Pulmonary/Chest: Effort normal and breath sounds normal.  Abdominal: Soft. Bowel sounds are normal. He exhibits no mass. There is no tenderness.  Genitourinary:  He has an uncircumcised penis with massive swelling.  There is no blood at the meatus.   There is palpable clot at the base of the penis.   Scrotum and contents are unremarkable.     Musculoskeletal: Normal range of motion. He exhibits no edema or tenderness.  Neurological: He is alert and oriented to person, place, and time.  Skin: Skin is warm and dry.  Psychiatric: Mood and affect normal.    Lab Results:  No results for input(s): WBC, HGB, HCT, PLT in the last 72 hours. BMET No results for input(s): NA, K, CL, CO2, GLUCOSE, BUN, CREATININE, CALCIUM in the last 72 hours. PT/INR No results for input(s): LABPROT, INR in the last 72 hours. ABG No results for input(s): PHART, HCO3 in the last 72 hours.  Invalid input(s): PCO2, PO2  Studies/Results: No results found.  Assessment: Penile fracture He has a probable penile fracture.  I am going to take him to the operating room today for repair of the fracture.   I have reviewed the risks of bleeding, infection, erectile dysfunction, penile curvature or chronic pain, Urethral injury, penile numbness, thrombotic events and anesthetic complications.      CC: Dr. Salvatore Marvel.      Iyona Pehrson J 12/06/2014 [phone]

## 2014-12-06 NOTE — Assessment & Plan Note (Addendum)
He is doing well following repair of the penile fracture involving the right proximal corpora and spongiosum.  Wound drain removed.   He will go home with the foley which will be removed on Monday.

## 2014-12-06 NOTE — ED Notes (Signed)
Pt urinal had . Completed bladder scanner and results >225ml. Pt is using urinal now.

## 2014-12-06 NOTE — ED Provider Notes (Signed)
CSN: [id number]     Arrival date & time 12/06/14  1018 History   First MD Initiated Contact with Patient 12/06/14 1026     Chief Complaint  Patient presents with  . Groin Swelling     HPI Comments: Juan Mcintyre is a 49 y.o. Male who presents with a swollen penis. Patient was masturbating, admitted to being "a bit rough," and when he was about to ejaculate, pt bent his penis, which produced intense pain, felt a "pop," and his penis immediately began to swell. Pt had painful ejaculation, but has not urinated yet. Pt denies any previous penile abnormalities, use of ED medications, or street drugs. Denies bloody discharge from urethra.   Past Medical History  Diagnosis Date  . History of Helicobacter pylori infection   . Acute low back pain   . Hematuria   . A-fib   . Sickle cell trait    Past Surgical History  Procedure Laterality Date  . Other surgical history   07/2011    endo   Family History  Problem Relation Age of Onset  . Hypertension Mother   . Alzheimer's disease Father    Social History  Substance Use Topics  . Smoking status: Never Smoker   . Smokeless tobacco: None  . Alcohol Use: No    Review of Systems  Genitourinary: Positive for penile swelling and scrotal swelling. Negative for testicular pain.      Allergies  Review of patient's allergies indicates no known allergies.  Home Medications   Prior to Admission medications   Medication Sig Start Date End Date Taking? Authorizing Provider  docusate sodium (COLACE) 100 MG capsule Take 1 capsule (100 mg total) by mouth 2 (two) times daily. 12/07/14   Bjorn Pippin, MD  HYDROcodone-acetaminophen (NORCO/VICODIN) 5-325 MG tablet Take 1-2 tablets by mouth every 4 (four) hours as needed for moderate pain. 12/07/14   Bjorn Pippin, MD   BP 120/83 mmHg  Pulse 47  Temp(Src) 98.4 F (36.9 C) (Oral)  Resp 18  Ht  (1.753 m)  Wt 216 lb (97.977 kg)  BMI 31.88 kg/m2  SpO2 100% Physical Exam  Constitutional: He  is oriented to person, place, and time. He appears well-developed and well-nourished. No distress.  HENT:  Head: Normocephalic and atraumatic.  Eyes: Conjunctivae and EOM are normal. Pupils are equal, round, and reactive to light.  Cardiovascular: Normal rate, regular rhythm and normal heart sounds.   Pulmonary/Chest: Effort normal and breath sounds normal. No respiratory distress.  Abdominal: Soft. Bowel sounds are normal.  Genitourinary: Right testis shows swelling. Right testis shows no tenderness. Left testis shows swelling. Left testis shows no tenderness. Uncircumcised. No discharge found.  Diffuse, massive penile shaft swelling. No bleeding or discharge noted.  Musculoskeletal: He exhibits no edema or tenderness.  Neurological: He is alert and oriented to person, place, and time.  Skin: Skin is warm and dry. He is not diaphoretic.  Nursing note and vitals reviewed.   ED Course  Procedures (including critical care time) Labs Review Labs Reviewed - No data to display  Imaging Review No results found. I have personally reviewed and evaluated these images and lab results as part of my medical decision-making.   EKG Interpretation None      MDM   Final diagnoses:  Penile swelling    Juan Mcintyre presents with a swollen penis.  Findings and plan of care discussed with Dr. Hyacinth Meeker.   11:00AM Urology paged by unit secretary  Patient informed  that he would need to urinate as soon as possible or a foley catheter would have to be placed. Patient given water to drink.  Patient urinated about 200cc. Bladder scan revealed empty bladder. No gross blood noted.  12:13 PM Spoke on phone with Dr. Annabell Howells, Urology. The consultant will come see the patient in the ED.  Appreciate Urology input, plan of care discussed with patient and patient agrees to plan of care. Urology determined patient to be surgical. Pt to be admitted.  Anselm Pancoast, PA-C 12/07/14 1610  Eber Hong,  MD 12/10/14 [phone]

## 2014-12-06 NOTE — ED Notes (Signed)
Instructed by co-worker of Wrenn MD to hold the antibiotic and send to OR with patient.

## 2014-12-06 NOTE — Brief Op Note (Signed)
12/06/2014  6:39 PM  PATIENT:  Juan Mcintyre  49 y.o. male  PRE-OPERATIVE DIAGNOSIS:  penile fracture  POST-OPERATIVE DIAGNOSIS:  Penile fracture involvine right corpora and corpora spongiosum.  PROCEDURE:  Procedure(s): REPAIR OF FRACTURED PENIS (N/A)  SURGEON:  Surgeon(s) and Role:    * Bjorn Pippin, MD - Primary  PHYSICIAN ASSISTANT:   ASSISTANTS: none   ANESTHESIA:   general  EBL:  Total I/O In: 500 [I.V.:500] Out: 50 [Blood:50]  BLOOD ADMINISTERED:none  DRAINS: Penrose drain in the scrotum and Urinary Catheter (Foley)   LOCAL MEDICATIONS USED:  NONE  SPECIMEN:  No Specimen  DISPOSITION OF SPECIMEN:  N/A  COUNTS:  YES  TOURNIQUET:  * No tourniquets in log *  DICTATION: .Other Dictation: Dictation Number [id number]  PLAN OF CARE: Admit for overnight observation  PATIENT DISPOSITION:  PACU - hemodynamically stable.   Delay start of Pharmacological VTE agent (>24hrs) due to surgical blood loss or risk of bleeding: not applicable

## 2014-12-06 NOTE — ED Notes (Signed)
Bed: WA02 Expected date:  Expected time:  Means of arrival:  Comments: 

## 2014-12-06 NOTE — ED Notes (Signed)
Pt given 6 wipes for OR cleansing

## 2014-12-06 NOTE — Anesthesia Procedure Notes (Signed)
Procedure Name: LMA Insertion Date/Time: 12/06/2014 5:39 PM Performed by: Paris Lore Pre-anesthesia Checklist: Patient identified, Emergency Drugs available, Suction available, Patient being monitored and Timeout performed Patient Re-evaluated:Patient Re-evaluated prior to inductionOxygen Delivery Method: Circle system utilized Preoxygenation: Pre-oxygenation with 100% oxygen Intubation Type: IV induction Ventilation: Mask ventilation without difficulty LMA: LMA inserted LMA Size: 4.0 Number of attempts: 1 Placement Confirmation: positive ETCO2 and breath sounds checked- equal and bilateral Tube secured with: Tape

## 2014-12-07 ENCOUNTER — Encounter (HOSPITAL_COMMUNITY): Payer: Self-pay | Admitting: Emergency Medicine

## 2014-12-07 ENCOUNTER — Emergency Department (HOSPITAL_COMMUNITY)
Admission: EM | Admit: 2014-12-07 | Discharge: 2014-12-08 | Disposition: A | Discharge status: Home / Self Care | Payer: 59 | Source: Home / Self Care | Attending: Emergency Medicine | Admitting: Emergency Medicine

## 2014-12-07 DIAGNOSIS — Z8619 Personal history of other infectious and parasitic diseases: Secondary | ICD-10-CM

## 2014-12-07 DIAGNOSIS — T839XXA Unspecified complication of genitourinary prosthetic device, implant and graft, initial encounter: Secondary | ICD-10-CM

## 2014-12-07 DIAGNOSIS — Y846 Urinary catheterization as the cause of abnormal reaction of the patient, or of later complication, without mention of misadventure at the time of the procedure: Secondary | ICD-10-CM

## 2014-12-07 DIAGNOSIS — Z8679 Personal history of other diseases of the circulatory system: Secondary | ICD-10-CM

## 2014-12-07 DIAGNOSIS — R339 Retention of urine, unspecified: Secondary | ICD-10-CM

## 2014-12-07 DIAGNOSIS — Z862 Personal history of diseases of the blood and blood-forming organs and certain disorders involving the immune mechanism: Secondary | ICD-10-CM

## 2014-12-07 DIAGNOSIS — T83038A Leakage of other indwelling urethral catheter, initial encounter: Secondary | ICD-10-CM

## 2014-12-07 MED ORDER — DOCUSATE SODIUM 100 MG PO CAPS: 100.0000 mg | ORAL_CAPSULE | Freq: Two times a day (BID) | ORAL | Status: DC

## 2014-12-07 MED ORDER — HYDROCODONE-ACETAMINOPHEN 5-325 MG PO TABS: 1.0000 | ORAL_TABLET | ORAL | Status: DC | PRN

## 2014-12-07 NOTE — ED Notes (Signed)
Pt discharged today with Foley cath after penile surgery. Pt complains of an obstruction that is blocking the flow of urine into the bag that is causing pain. Pt states  Penis enlarges when he urinates but does not come through catheter.

## 2014-12-07 NOTE — Discharge Summary (Signed)
  Physician Discharge Summary  Patient ID: Juan Mcintyre MRN: [id number] DOB/AGE: [birth year] 47 y.o.  Admit date: 12/06/2014 Discharge date: 12/07/2014  Admission Diagnoses:  Penile fracture  Discharge Diagnoses:  Principal Problem:   Penile fracture   Past Medical History  Diagnosis Date  . History of Helicobacter pylori infection   . Acute low back pain   . Hematuria   . A-fib   . Sickle cell trait     Surgeries: Procedure(s): REPAIR OF FRACTURED PENIS on 12/06/2014   Consultants (if any): Treatment Team:  Bjorn Pippin, MD  Discharged Condition: Improved  Hospital Course: OLVIN ROHR is an 49 y.o. male who was admitted 12/06/2014 with a diagnosis of Penile fracture and went to the operating room on 12/06/2014 and underwent the above named procedures.  The wound drain was removed this morning.  His urine  Is clear.   He was given perioperative antibiotics:  Anti-infectives    Start     Dose/Rate Route Frequency Ordered Stop   12/06/14 1400  ceFAZolin (ANCEF) IVPB 2 g/50 mL premix  Status:  Discontinued     2 g 100 mL/hr over 30 Minutes Intravenous 3 times per day 12/06/14 1243 12/06/14 2021    .  He was given sequential compression devices  for DVT prophylaxis.  He benefited maximally from the hospital stay and there were no complications.    Recent vital signs:  Filed Vitals:   12/07/14 0452  BP: 120/83  Pulse: 47  Temp: 98.4 F (36.9 C)  Resp: 18    Recent laboratory studies:  Lab Results  Component Value Date   HGB 17.5* 08/02/2012   HGB 17.1* 09/20/2007   HGB 15.7 04/25/2006   Lab Results  Component Value Date   WBC 9.2 08/02/2012   PLT 207 08/02/2012   No results found for: INR Lab Results  Component Value Date   NA 142 08/02/2012   K 3.7 08/02/2012   CL 104 08/02/2012   CO2 24 08/02/2012   BUN 14 08/02/2012   CREATININE 1.18 08/02/2012   GLUCOSE 102* 08/02/2012    Discharge Medications:     Medication List    TAKE these  medications        docusate sodium 100 MG capsule  Commonly known as:  COLACE  Take 1 capsule (100 mg total) by mouth 2 (two) times daily.     HYDROcodone-acetaminophen 5-325 MG tablet  Commonly known as:  NORCO/VICODIN  Take 1-2 tablets by mouth every 4 (four) hours as needed for moderate pain.        Diagnostic Studies: No results found.  Disposition: 01-Home or Self Care   Penile fracture He is doing well following repair of the penile fracture involving the right proximal corpora and spongiosum.  Wound drain removed.   He will go home with the foley which will be removed on Monday.         Follow-up Information    Follow up with Anner Crete, MD.   Specialty:  Urology   Why:  come to the office Monday at 8 am for removal of the catheter and drain.    Contact information:   [address] [place] [address] [phone]        Signed: Anner Crete 12/07/2014, 7:48 AM

## 2014-12-07 NOTE — Op Note (Signed)
NAMEAHSAN, Juan Mcintyre NO.:  [id number]  MEDICAL RECORD NO.:  [id number]  LOCATION:  1418                         FACILITY:  [facility]  PHYSICIAN:  Excell Seltzer. Annabell Howells, M.D.    DATE OF BIRTH:  April 06, 1965  DATE OF PROCEDURE:  12/06/2014 DATE OF DISCHARGE:                              OPERATIVE REPORT   PROCEDURE:  Repair of penile fracture.  PREOPERATIVE DIAGNOSIS:  Penile fracture.  POSTOPERATIVE DIAGNOSIS:  Penile fracture, with involvement of the right proximal corpora and urethral spongiosum.  SURGEON:  Excell Seltzer. Annabell Howells, M.D.  ANESTHESIA:  General.  BLOOD LOSS:  Approximately 50 mL.  DRAINS:  A 16-French Foley catheter and quarter-inch Penrose drain.  SPECIMEN:  None.  COMPLICATIONS:  None.  INDICATIONS:  Mr. Chrismer is a 49 year old African American male, who noted during masturbation this morning, a sudden pop at the base the penis with prompt deep tumescence of the penis followed by rapid swelling.  He was initially seen by his PCP, referred him to the emergency room, where I saw him earlier today.  He had the classic findings on history and exam of the penile fracture, palpable in the subcutaneous tissue at the base of the penis.  He had voided clear urine and was not felt to have injury to the urethra at least not into the lumen of the urethra.  He was given a fully informed consent, including the risks of bleeding, infection, penile deformity, persistent erectile dysfunction, penile pain, penile anesthesia, thrombotic events and anesthetic complications, and elected to proceed to surgical repair.  FINDINGS OF PROCEDURE:  He was taken to the operating room, where he was given 2 g of Ancef and was fitted with PAS hose.  General anesthetic was induced, with him in the supine position.  His genitalia were clipped and he was prepped with Betadine solution and draped in usual sterile fashion.  Due to the proximal location of the palpable clot at the base  the penis and the upper scrotum.  A midline incision was made ventrally over the penoscrotal junction with a knife.  This was carried through the dartos with the Bovie and with finger dissection.  I was able to spread the dartos down to the corpora, where a large clot was evacuated.  I promptly identified a transverse tear in the right corpora that extended actually into the urethral spongiosum, but not through the mucosa, based on palpation and lack of bleeding.  The tear was approximately 2 to 2.5 cm in length and extended to the lateral corpora.  Once the lesion was identified, the corporal tear was repaired using a running 2-0 PDS.  The spongiosal repair was closed with a running 3-0 Vicryl.  Once both components of the injury were repaired and hemostasis was secured.  Additional clot and blood was evacuated and a latex-free Penrose drain was placed through a separate stab wound in the right inferior scrotum.  This was placed over the area of the repair.  The dartos was closed in 2 layers with a running 3-0 chromic suture.  The skin was closed with a running vertical mattress, 3-0 chromic.  The wound was cleansed, dried, and Dermabond was applied.  The drain was secured with a 3-0 chromic.  A dressing of 4x4s, fluff, Kerlix, and a scrotal support was applied.  A 16-French silastic Foley was placed at the beginning of the procedure.  This had been used to aid evaluation of the urethral injury and determine that there was no mucosal component, but it was felt best that this should be left indwelling and was connected to straight drainage.  His anesthetic was then reversed and he was moved to recovery room in stable condition.  He will be kept overnight in the hospital and discharged home.  I will leave his catheter in through the weekend, and have him return to the office on Monday for removal.     Excell Seltzer. Annabell Howells, M.D.     JJW/MEDQ  D:  12/06/2014  T:  12/07/2014  Job:   [id number]

## 2014-12-08 NOTE — ED Notes (Signed)
Bed: WTR7 Expected date:  Expected time:  Means of arrival:  Comments: 

## 2014-12-08 NOTE — Discharge Instructions (Signed)
Monitor catheter for proper urine flow.  Also monitor for blood in urine. Follow-up with Dr. Annabell Howells on Monday as scheduled. Return here for new/worsening symptoms.

## 2014-12-08 NOTE — ED Provider Notes (Signed)
CSN: [id number]     Arrival date & time 12/07/14  2333 History   First MD Initiated Contact with Patient 12/08/14 0021     Chief Complaint  Patient presents with  . Urinary Retention  . Post-op Problem     (Consider location/radiation/quality/duration/timing/severity/associated sxs/prior Treatment) The history is provided by the patient and medical records.    This is a 49 year old male with history of A. fib, sickle cell trait, hematuria, presenting to the ED for problems with his Foley catheter. 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 Annabell Howells.  States today he noticed that he was having some "leaking" from his penis around the tubing of his foley catheter and he had increased pain, however no urine was collecting in leg pain.  He states he feels there is a "blockage" in the tube.  He denies fever, chills, sweats, abdominal pain, testicle pain, or any other related symptoms.  States he is scheduled to FU with Dr. Annabell Howells in 2 days to have catheter removed.  VSS.  Past Medical History  Diagnosis Date  . History of Helicobacter pylori infection   . Acute low back pain   . Hematuria   . A-fib (HCC)   . Sickle cell trait [facility])    Past Surgical History  Procedure Laterality Date  . Other surgical history   07/2011    endo   Family History  Problem Relation Age of Onset  . Hypertension Mother   . Alzheimer's disease Father    Social History  Substance Use Topics  . Smoking status: Never Smoker   . Smokeless tobacco: None  . Alcohol Use: No    Review of Systems  Genitourinary:       Foley catheter problem  All other systems reviewed and are negative.     Allergies  Review of patient's allergies indicates no known allergies.  Home Medications   Prior to Admission medications   Medication Sig Start Date End Date Taking? Authorizing Provider  docusate sodium (COLACE) 100 MG capsule Take 1 capsule (100  mg total) by mouth 2 (two) times daily. 12/07/14   Bjorn Pippin, MD  HYDROcodone-acetaminophen (NORCO/VICODIN) 5-325 MG tablet Take 1-2 tablets by mouth every 4 (four) hours as needed for moderate pain. 12/07/14   Bjorn Pippin, MD   BP 150/94 mmHg  Pulse 80  Temp(Src) 99.3 F (37.4 C) (Oral)  Resp 16  SpO2 96%   Physical Exam  Constitutional: He is oriented to person, place, and time. He appears well-developed and well-nourished. No distress.  HENT:  Head: Normocephalic and atraumatic.  Mouth/Throat: Oropharynx is clear and moist.  Eyes: Conjunctivae and EOM are normal. Pupils are equal, round, and reactive to light.  Neck: Normal range of motion. Neck supple.  Cardiovascular: Normal rate, regular rhythm and normal heart sounds.   Pulmonary/Chest: Effort normal and breath sounds normal. No respiratory distress. He has no wheezes.  Abdominal: Soft. Bowel sounds are normal. There is no tenderness. There is no guarding.  Genitourinary: Testes normal and penis normal. Uncircumcised. No penile erythema or penile tenderness. No discharge found.  Foley catheter in place without signs of skin infection; clear urine is easily flowing into leg bag  Musculoskeletal: Normal range of motion. He exhibits no edema.  Neurological: He is alert and oriented to person, place, and time.  Skin: Skin is warm and dry. He is not diaphoretic.  Psychiatric: He has a normal mood and  affect.  Nursing note and vitals reviewed.   ED Course  Procedures (including critical care time) Labs Review Labs Reviewed - No data to display  Imaging Review No results found.   EKG Interpretation None      MDM   Final diagnoses:  Foley catheter problem, initial encounter [facility])   49 y.o. M here with foley catheter problem.  Patient had surgery yesterday with Dr. Annabell Howells due to rupture of corpus cavernosum.  Patient afebrile, non-toxic.  Foley catheter was adjusted and irrigated prior to my exam.  There is clear urine  easily flowing into the leg bag.  Patient reports relief of pain/pressure sensation.  No further complaints at this time.  Patient stable for discharge.  He has follow-up with Dr. Annabell Howells tomorrow.  Discussed plan with patient, he/she acknowledged understanding and agreed with plan of care.  Return precautions given for new or worsening symptoms.  Garlon Hatchet, PA-C 12/08/14 0038  Lorre Nick, MD 12/10/14 [phone]

## 2014-12-08 NOTE — ED Notes (Signed)
Patient stated his penis is leaking and hurts when urine starts to stream into the foley bag. Nurse manipulated the foley and flushed the foley. Patient is currently draining urine without pain.

## 2014-12-09 ENCOUNTER — Encounter (HOSPITAL_COMMUNITY): Payer: Self-pay | Admitting: Urology

## 2015-05-03 ENCOUNTER — Encounter (HOSPITAL_COMMUNITY): Payer: Self-pay | Admitting: Oncology

## 2015-05-03 ENCOUNTER — Emergency Department (HOSPITAL_COMMUNITY)
Admission: EM | Admit: 2015-05-03 | Discharge: 2015-05-04 | Disposition: A | Discharge status: Home / Self Care | Payer: Self-pay | Attending: Emergency Medicine | Admitting: Emergency Medicine

## 2015-05-03 DIAGNOSIS — Z8619 Personal history of other infectious and parasitic diseases: Secondary | ICD-10-CM | POA: Insufficient documentation

## 2015-05-03 DIAGNOSIS — Z79899 Other long term (current) drug therapy: Secondary | ICD-10-CM | POA: Insufficient documentation

## 2015-05-03 DIAGNOSIS — R002 Palpitations: Secondary | ICD-10-CM | POA: Insufficient documentation

## 2015-05-03 DIAGNOSIS — I10 Essential (primary) hypertension: Secondary | ICD-10-CM

## 2015-05-03 DIAGNOSIS — Z862 Personal history of diseases of the blood and blood-forming organs and certain disorders involving the immune mechanism: Secondary | ICD-10-CM | POA: Insufficient documentation

## 2015-05-03 NOTE — ED Notes (Signed)
Pt presents d/t HTN x 1 day.  Pt states he took one of his friends BP medications however does not know what it was.  BP at home was 151/10.  In triage pt's BP is 140/80 manually.

## 2015-05-04 NOTE — Discharge Instructions (Signed)
Hypertension Hypertension, commonly called high blood pressure, is when the force of blood pumping through your arteries is too strong. Your arteries are the blood vessels that carry blood from your heart throughout your body. A blood pressure reading consists of a higher number over a lower number, such as 110/72. The higher number (systolic) is the pressure inside your arteries when your heart pumps. The lower number (diastolic) is the pressure inside your arteries when your heart relaxes. Ideally you want your blood pressure below 120/80. Hypertension forces your heart to work harder to pump blood. Your arteries may become narrow or stiff. Having untreated or uncontrolled hypertension can cause heart attack, stroke, kidney disease, and other problems. RISK FACTORS Some risk factors for high blood pressure are controllable. Others are not.  Risk factors you cannot control include:   Race. You may be at higher risk if you are African American.  Age. Risk increases with age.  Gender. Men are at higher risk than women before age 45 years. After age 65, women are at higher risk than men. Risk factors you can control include:  Not getting enough exercise or physical activity.  Being overweight.  Getting too much fat, sugar, calories, or salt in your diet.  Drinking too much alcohol. SIGNS AND SYMPTOMS Hypertension does not usually cause signs or symptoms. Extremely high blood pressure (hypertensive crisis) may cause headache, anxiety, shortness of breath, and nosebleed. DIAGNOSIS To check if you have hypertension, your health care provider will measure your blood pressure while you are seated, with your arm held at the level of your heart. It should be measured at least twice using the same arm. Certain conditions can cause a difference in blood pressure between your right and left arms. A blood pressure reading that is higher than normal on one occasion does not mean that you need treatment. If  it is not clear whether you have high blood pressure, you may be asked to return on a different day to have your blood pressure checked again. Or, you may be asked to monitor your blood pressure at home for 1 or more weeks. TREATMENT Treating high blood pressure includes making lifestyle changes and possibly taking medicine. Living a healthy lifestyle can help lower high blood pressure. You may need to change some of your habits. Lifestyle changes may include:  Following the DASH diet. This diet is high in fruits, vegetables, and whole grains. It is low in salt, red meat, and added sugars.  Keep your sodium intake below 2,300 mg per day.  Getting at least 30-45 minutes of aerobic exercise at least 4 times per week.  Losing weight if necessary.  Not smoking.  Limiting alcoholic beverages.  Learning ways to reduce stress. Your health care provider may prescribe medicine if lifestyle changes are not enough to get your blood pressure under control, and if one of the following is true:  You are 18-59 years of age and your systolic blood pressure is above 140.  You are 60 years of age or older, and your systolic blood pressure is above 150.  Your diastolic blood pressure is above 90.  You have diabetes, and your systolic blood pressure is over 140 or your diastolic blood pressure is over 90.  You have kidney disease and your blood pressure is above 140/90.  You have heart disease and your blood pressure is above 140/90. Your personal target blood pressure may vary depending on your medical conditions, your age, and other factors. HOME CARE INSTRUCTIONS    Have your blood pressure rechecked as directed by your health care provider.   Take medicines only as directed by your health care provider. Follow the directions carefully. Blood pressure medicines must be taken as prescribed. The medicine does not work as well when you skip doses. Skipping doses also puts you at risk for  problems.  Do not smoke.   Monitor your blood pressure at home as directed by your health care provider. SEEK MEDICAL CARE IF:   You think you are having a reaction to medicines taken.  You have recurrent headaches or feel dizzy.  You have swelling in your ankles.  You have trouble with your vision. SEEK IMMEDIATE MEDICAL CARE IF:  You develop a severe headache or confusion.  You have unusual weakness, numbness, or feel faint.  You have severe chest or abdominal pain.  You vomit repeatedly.  You have trouble breathing. MAKE SURE YOU:   Understand these instructions.  Will watch your condition.  Will get help right away if you are not doing well or get worse.   This information is not intended to replace advice given to you by your health care provider. Make sure you discuss any questions you have with your health care provider.   Document Released: 02/22/2005 Document Revised: 07/09/2014 Document Reviewed: 12/15/2012 Elsevier Interactive Patient Education 2016 [employer]  

## 2015-05-04 NOTE — ED Provider Notes (Signed)
CSN: [id number]     Arrival date & time 05/03/15  2249 History   First MD Initiated Contact with Patient 05/04/15 0005     Chief Complaint  Patient presents with  . Hypertension     (Consider location/radiation/quality/duration/timing/severity/associated sxs/prior Treatment) HPI Comments: Patient is a 50 year old male with a history of a child fibrillation and sickle cell trait who presents to the ED for evaluation of hypertension 1 day. Patient states that he has a history of high blood pressure, but has been off of this medication since increasing his daily exercise. Patient states, however, that he has not been to the gym in 2-3 months. He states that he took his blood pressure in the morning this morning and it was 180s over 110. He states that he usually runs "normal" at 120/80. Patient states that he began to feel dizzy and nauseous with palpitations when at work today. He took his blood pressure and it was 151/108. At this time, patient took half a tablet of one of his friend's blood pressure medications. He reports that he had an old bottle of his blood pressure medicine at work with him to confirm that he was taking the same medication that he used to be on. He feels as though this helped his symptoms. He has no complaints of symptoms at this time.  PCP - Dr. Nicholos Johns  Patient is a 50 y.o. male presenting with hypertension. The history is provided by the patient. No language interpreter was used.  Hypertension Associated symptoms include headaches and nausea. Pertinent negatives include no chest pain, fever, numbness, vomiting or weakness.    Past Medical History  Diagnosis Date  . History of Helicobacter pylori infection   . Acute low back pain   . Hematuria   . A-fib (HCC)   . Sickle cell trait [facility])    Past Surgical History  Procedure Laterality Date  . Other surgical history   07/2011    endo  . Repair of fractured penis N/A 12/06/2014    Procedure: REPAIR OF  FRACTURED PENIS;  Surgeon: Bjorn Pippin, MD;  Location: [facility] ORS;  Service: Urology;  Laterality: N/A;   Family History  Problem Relation Age of Onset  . Hypertension Mother   . Alzheimer's disease Father    Social History  Substance Use Topics  . Smoking status: Never Smoker   . Smokeless tobacco: None  . Alcohol Use: No    Review of Systems  Constitutional: Negative for fever.  Eyes:       +"eyes get glassy" No vision loss  Respiratory: Negative for shortness of breath.   Cardiovascular: Positive for palpitations. Negative for chest pain.  Gastrointestinal: Positive for nausea. Negative for vomiting.  Neurological: Positive for dizziness and headaches. Negative for weakness and numbness.  All other systems reviewed and are negative.   Allergies  Review of patient's allergies indicates no known allergies.  Home Medications   Prior to Admission medications   Medication Sig Start Date End Date Taking? Authorizing Provider  docusate sodium (COLACE) 100 MG capsule Take 1 capsule (100 mg total) by mouth 2 (two) times daily. 12/07/14   Bjorn Pippin, MD  HYDROcodone-acetaminophen (NORCO/VICODIN) 5-325 MG tablet Take 1-2 tablets by mouth every 4 (four) hours as needed for moderate pain. 12/07/14   Bjorn Pippin, MD   BP 140/80 mmHg  Pulse 69  Temp(Src) 98.3 F (36.8 C) (Oral)  Resp 18  Ht 6' (1.829 m)  Wt 97.523 kg  BMI 29.15 kg/m2  SpO2  99%   Physical Exam  Constitutional: He is oriented to person, place, and time. He appears well-developed and well-nourished. No distress.  Nontoxic/nonseptic appearing  HENT:  Head: Normocephalic and atraumatic.  Eyes: Conjunctivae and EOM are normal. No scleral icterus.  Neck: Normal range of motion.  No JVD  Cardiovascular: Normal rate, regular rhythm and intact distal pulses.   Pulmonary/Chest: Effort normal and breath sounds normal. No respiratory distress. He has no wheezes. He has no rales.  Respirations even and unlabored. Lungs clear.   Musculoskeletal: Normal range of motion. He exhibits no edema.  Neurological: He is alert and oriented to person, place, and time. He exhibits normal muscle tone. Coordination normal.  Ambulatory with steady gait.  Skin: Skin is warm and dry. No rash noted. He is not diaphoretic. No erythema. No pallor.  Psychiatric: He has a normal mood and affect. His behavior is normal.  Nursing note and vitals reviewed.   ED Course  Procedures (including critical care time) Labs Review Labs Reviewed - No data to display  Imaging Review No results found.   I have personally reviewed and evaluated these images and lab results as part of my medical decision-making.   EKG Interpretation   Date/Time:  Sunday May 04 2015 00:41:09 EST Ventricular Rate:  59 PR Interval:  172 QRS Duration: 110 QT Interval:  409 QTC Calculation: 405 R Axis:   47 Text Interpretation:  Sinus rhythm Borderline repolarization abnormality  No significant change since last tracing Confirmed by HORTON  MD, Toni Amend  ([id number]) on 05/04/2015 12:45:57 AM      MDM   Final diagnoses:  Essential hypertension    50 year old male sent to the ED for symptomatic hypertension. He is asymptomatic at this time and blood pressure is fairly well controlled. EKG shows no new changes or evidence of STEMI. Physical exam is reassuring. Patient reports previously being on blood pressure medications, but he does not know what medication he was on. Unable to obtain this info from prior Epic charts. Given reassuring blood pressure this time, I believe the patient is stable for outpatient follow-up with his primary care doctor for recheck of his blood pressure and to further discuss restarting anti-hypertensives. Return precautions discussed and provided. Also discussed exercise and dietary changes. Patient agreeable to plan with no unaddressed concerns; discharged in good condition.   Filed Vitals:   05/03/15 2304 05/03/15 2316  BP:  141/96 140/80  Pulse: 69   Temp: 98.3 F (36.8 C)   TempSrc: Oral   Resp: 18   Height: 6' (1.829 m)   Weight: 97.523 kg   SpO2: 99%      Antony Madura, PA-C 05/04/15 9811  Shon Baton, MD 05/04/15 2307

## 2015-08-18 ENCOUNTER — Other Ambulatory Visit: Payer: Self-pay | Admitting: Internal Medicine

## 2015-08-18 ENCOUNTER — Ambulatory Visit
Admission: RE | Admit: 2015-08-18 | Discharge: 2015-08-18 | Disposition: A | Discharge status: Home / Self Care | Payer: BLUE CROSS/BLUE SHIELD | Source: Ambulatory Visit | Attending: Internal Medicine | Admitting: Internal Medicine

## 2015-08-18 DIAGNOSIS — R221 Localized swelling, mass and lump, neck: Secondary | ICD-10-CM

## 2015-08-18 MED ORDER — IOPAMIDOL (ISOVUE-300) INJECTION 61%
75.0000 mL | Freq: Once | INTRAVENOUS | Status: AC | PRN
  Administered 2015-08-18: 75 mL via INTRAVENOUS

## 2015-08-29 ENCOUNTER — Emergency Department (HOSPITAL_COMMUNITY)
Admission: EM | Admit: 2015-08-29 | Discharge: 2015-08-30 | Disposition: A | Discharge status: Home / Self Care | Payer: BLUE CROSS/BLUE SHIELD | Attending: Emergency Medicine | Admitting: Emergency Medicine

## 2015-08-29 ENCOUNTER — Encounter (HOSPITAL_COMMUNITY): Payer: Self-pay | Admitting: Emergency Medicine

## 2015-08-29 DIAGNOSIS — I4891 Unspecified atrial fibrillation: Secondary | ICD-10-CM | POA: Insufficient documentation

## 2015-08-29 DIAGNOSIS — Z7982 Long term (current) use of aspirin: Secondary | ICD-10-CM | POA: Insufficient documentation

## 2015-08-29 DIAGNOSIS — R51 Headache: Secondary | ICD-10-CM | POA: Diagnosis not present

## 2015-08-29 DIAGNOSIS — Z79899 Other long term (current) drug therapy: Secondary | ICD-10-CM | POA: Insufficient documentation

## 2015-08-29 DIAGNOSIS — Z711 Person with feared health complaint in whom no diagnosis is made: Secondary | ICD-10-CM

## 2015-08-29 LAB — I-STAT CHEM 8, ED
BUN: 21 mg/dL — ABNORMAL HIGH (ref 6–20)
CALCIUM ION: 1.21 mmol/L (ref 1.12–1.23)
CHLORIDE: 105 mmol/L (ref 101–111)
Creatinine, Ser: 1.2 mg/dL (ref 0.61–1.24)
Glucose, Bld: 154 mg/dL — ABNORMAL HIGH (ref 65–99)
HCT: 50 % (ref 39.0–52.0)
HEMOGLOBIN: 17 g/dL (ref 13.0–17.0)
Potassium: 3.5 mmol/L (ref 3.5–5.1)
SODIUM: 140 mmol/L (ref 135–145)
TCO2: 23 mmol/L (ref 0–100)

## 2015-08-29 LAB — PROTIME-INR
INR: 0.99 (ref 0.00–1.49)
PROTHROMBIN TIME: 13.3 s (ref 11.6–15.2)

## 2015-08-29 NOTE — ED Notes (Signed)
Pt c/o "warm feeling" from top of L side of head radiating down to abdomen, pt states feeling lasts @10seconds , episode Tuesday then again today. Pt had MRI for swollen area to L posterior neck 2 days ago. Pt denies any neuro s/s with episodes.

## 2015-08-30 ENCOUNTER — Encounter (HOSPITAL_COMMUNITY): Payer: Self-pay | Admitting: Emergency Medicine

## 2015-08-30 ENCOUNTER — Emergency Department (HOSPITAL_COMMUNITY): Payer: BLUE CROSS/BLUE SHIELD

## 2015-08-30 LAB — CBC
HEMATOCRIT: 44.4 % (ref 39.0–52.0)
Hemoglobin: 16.4 g/dL (ref 13.0–17.0)
MCH: 27.5 pg (ref 26.0–34.0)
MCHC: 36.9 g/dL — AB (ref 30.0–36.0)
MCV: 74.4 fL — AB (ref 78.0–100.0)
PLATELETS: 209 10*3/uL (ref 150–400)
RBC: 5.97 MIL/uL — ABNORMAL HIGH (ref 4.22–5.81)
RDW: 14.1 % (ref 11.5–15.5)
WBC: 7.9 10*3/uL (ref 4.0–10.5)

## 2015-08-30 NOTE — ED Provider Notes (Signed)
CSN: [id number]     Arrival date & time 08/29/15  2250 History  By signing my name below, I, [facility], attest that this documentation has been prepared under the direction and in the presence of Barry Culverhouse, MD. Electronically Signed: Randell Patient, ED Scribe. 08/30/2015. 3:26 AM.    Chief Complaint  Patient presents with  . Headache    Patient is a 50 y.o. male presenting with general illness. The history is provided by the patient. No language interpreter was used.  Illness Location:  Left side of the upper body Quality:  [place]th Severity:  Mild Onset quality:  Sudden Duration: 2 episodes lasting < 10 seconds. 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 male with a hx of sick cell trait and A-Fib who presents to the Emergency Department complaining of one episode of paresthesia on the left side of his body from that occurred yesterday afternoon. Pt states that he was sitting down in a chair when he felt a warm sensation from his left-side abdomen that radiated to the left-side of his face which lasted for 10 seconds before resolving. 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 He has a follow-up appointment on 09/15/15 for this condition with an ENT but is unable to recall the name of this provider. Denies taking any medications regularly. Denies fever, vomiting, congestion, sneezing, rashes, abdominal pain, visual changes, eye itching,  tinnitus, changes in hearing or any other symptoms currently.  Past Medical History  Diagnosis Date  . History of Helicobacter pylori infection   . Acute low back pain   . Hematuria   . A-fib (HCC)   . Sickle cell trait [facility])    Past Surgical History  Procedure Laterality Date  . Other surgical history   07/2011    endo  . Repair of fractured penis N/A 12/06/2014    Procedure: REPAIR OF FRACTURED PENIS;  Surgeon: Bjorn Pippin, MD;  Location: [facility]S;  Service: Urology;  Laterality: N/A;   Family History  Problem Relation Age of Onset  . Hypertension Mother   . Alzheimer's disease Father    Social History  Substance Use Topics  . Smoking status: Never Smoker   . Smokeless tobacco: None  . Alcohol Use: No    Review of Systems  Constitutional: Negative for fever and fatigue.  HENT: Positive for sneezing. Negative for congestion, ear pain, hearing loss, sore throat and tinnitus.   Eyes: Negative for itching and visual disturbance.  Respiratory: Negative for cough, shortness of breath and wheezing.   Cardiovascular: Negative for chest pain.  Gastrointestinal: Negative for vomiting and abdominal pain.  Skin: Negative for rash.  Neurological: Negative for dizziness, tremors, seizures, loss of consciousness, syncope, facial asymmetry, speech difficulty, weakness, light-headedness, numbness and headaches.  All other systems reviewed and are negative.     Allergies  Review of patient's allergies indicates no known allergies.  Home Medications  Prior to Admission medications   Medication Sig Start Date End Date Taking? Authorizing Provider  amLODipine (NORVASC) 2.5 MG tablet Take 2.5 mg by mouth daily. 07/04/15  Yes Historical Provider, MD  aspirin EC 325 MG tablet Take 325 mg by mouth daily.   Yes Historical Provider, MD  docusate sodium (COLACE) 100 MG capsule Take 1 capsule (100 mg total) by mouth 2 (two) times daily. Patient not taking: Reported on 08/30/2015 12/07/14   Bjorn PippinJohn  Wrenn, MD  HYDROcodone-acetaminophen (NORCO/VICODIN) 5-325 MG tablet Take 1-2 tablets by mouth every 4 (four) hours as needed for moderate pain. Patient not taking: Reported on 08/30/2015 12/07/14   Bjorn PippinJohn Wrenn, MD   BP 134/94 mmHg  Pulse 59  Temp(Src) 97.6 F (36.4 C) (Oral)  Resp 20  Ht 6' (1.829 m)  Wt 216 lb (97.977 kg)  BMI 29.29 kg/m2  SpO2 96% Physical Exam  Constitutional: He is oriented to person, place, and time. He appears well-developed and well-nourished. No distress.  HENT:  Head: Normocephalic and atraumatic.  Mouth/Throat: Oropharynx is clear and moist and mucous membranes are normal. No oropharyngeal exudate.  Moist mucus membranes. No exudate.  Eyes: EOM are normal. Pupils are equal, round, and reactive to light. Right conjunctiva is injected. Left conjunctiva is injected.  Injected conjunctiva bilaterally. PERRL  Neck: Normal range of motion.  Cardiovascular: Normal rate, regular rhythm and intact distal pulses.   Regular rate and rhythm.  Pulmonary/Chest: Effort normal and breath sounds normal. No respiratory distress.  Lungs CTA bilaterally.  Abdominal: Soft. He exhibits no distension. There is no tenderness. There is no rebound and no guarding.  Abdomen soft.  Musculoskeletal: Normal range of motion.  Neurological: He is alert and oriented to person, place, and time. He has normal reflexes. He displays no atrophy, no tremor and normal reflexes. No cranial nerve deficit or sensory deficit. He exhibits normal muscle tone. He displays no seizure activity. Coordination and gait normal. GCS eye subscore is 4. GCS verbal subscore is 5. GCS motor subscore is 6.  Reflex Scores:      Tricep reflexes are 2+ on the right side and 2+ on the left side.      Bicep reflexes are 2+ on the right side and 2+ on the left side.      Brachioradialis reflexes are 2+ on the right side and 2+ on the left side.      Patellar reflexes are 2+ on the right side and 2+ on the left side.       Achilles reflexes are 2+ on the right side and 2+ on the left side. Cranial nerves II-XII intact. 5/5 strength in BUE and BLE.  Skin: Skin is warm and dry. He is not diaphoretic.  Psychiatric: He has a normal mood and affect. His behavior is normal.  Nursing note and vitals reviewed.   ED Course  Procedures (including critical care time)  DIAGNOSTIC STUDIES: Oxygen Saturation is 99% on RA, normal by my interpretation.    COORDINATION OF CARE: 2:47 AM Will consult with Radiology. Discussed treatment plan with pt at bedside and pt agreed to plan.     Labs Review Labs Reviewed  CBC - Abnormal; Notable for the following:    RBC 5.97 (*)    MCV 74.4 (*)    MCHC 36.9 (*)    All other components within normal limits  I-STAT CHEM 8, ED - Abnormal; Notable for the following:    BUN 21 (*)    Glucose, Bld  154 (*)    All other components within normal limits  PROTIME-INR    Imaging Review No results found. I have personally reviewed and evaluated these images and lab results as part of my medical decision-making.   EKG Interpretation None      MDM   Final diagnoses:  None    3:08 AM Consulted with radiologist Dr. Cherly Hensenhang who stated that the pt does not need a CT angio of the head & neck and advised that the usual stroke work-up protocol be followed because the AVM is 8 mm and the feeding vessels less than 5 mm which puts that pt at low risk for a blood vessel breaking off.   Will order head CT and consult with Neurology.  506 am case d/w neurology.  Patient does not require MRI nor MRI.  This is not a stroke and is not caused by small AVm.  Does need to see surgery but does not require further neuro work up   Patient has stated he has an appointment with the surgeon on the 10th.     Explained to patient what an AVM is ans that this is not a clot ans he has not had a stroke.   Follow up with surgery as directed, strict return precautions given.  Patient verbalizes  understanding and agrees to follow up   I personally performed the services described in this documentation, which was scribed in my presence. The recorded information has been reviewed and is accurate.     Cy BlamerApril Yazmine Sorey, MD 08/30/15 [phone]

## 2016-09-07 ENCOUNTER — Other Ambulatory Visit: Payer: Self-pay | Admitting: Internal Medicine

## 2016-09-07 DIAGNOSIS — R911 Solitary pulmonary nodule: Secondary | ICD-10-CM

## 2016-09-10 ENCOUNTER — Other Ambulatory Visit: Payer: BLUE CROSS/BLUE SHIELD

## 2016-09-16 ENCOUNTER — Inpatient Hospital Stay
Admission: RE | Admit: 2016-09-16 | Discharge: 2016-09-16 | Disposition: A | Discharge status: Home / Self Care | Payer: BLUE CROSS/BLUE SHIELD | Source: Ambulatory Visit | Attending: Internal Medicine | Admitting: Internal Medicine

## 2017-03-08 HISTORY — PX: LITHOTRIPSY: SUR834

## 2017-07-29 ENCOUNTER — Other Ambulatory Visit: Payer: Self-pay | Admitting: Urology

## 2017-07-29 ENCOUNTER — Encounter (HOSPITAL_COMMUNITY): Payer: Self-pay | Admitting: *Deleted

## 2017-08-03 ENCOUNTER — Other Ambulatory Visit: Payer: Self-pay | Admitting: Urology

## 2017-08-03 ENCOUNTER — Encounter (HOSPITAL_COMMUNITY): Payer: Self-pay | Admitting: General Practice

## 2017-08-03 NOTE — Progress Notes (Signed)
Patient took asa 81 mg on 08/02/17, office will be notified when open.

## 2017-08-03 NOTE — Progress Notes (Signed)
Office notified of patient taking aspirin, patient to be rescheduled.

## 2017-08-04 ENCOUNTER — Other Ambulatory Visit: Payer: Self-pay | Admitting: Urology

## 2017-08-09 ENCOUNTER — Encounter (HOSPITAL_COMMUNITY)
Admission: RE | Admit: 2017-08-09 | Discharge: 2017-08-09 | Disposition: A | Discharge status: Home / Self Care | Payer: BLUE CROSS/BLUE SHIELD | Source: Ambulatory Visit | Attending: Urology | Admitting: Urology

## 2017-08-09 ENCOUNTER — Encounter (HOSPITAL_COMMUNITY): Payer: Self-pay | Admitting: *Deleted

## 2017-08-09 DIAGNOSIS — Z01810 Encounter for preprocedural cardiovascular examination: Secondary | ICD-10-CM | POA: Insufficient documentation

## 2017-08-11 ENCOUNTER — Ambulatory Visit (HOSPITAL_COMMUNITY): Payer: BLUE CROSS/BLUE SHIELD

## 2017-08-11 ENCOUNTER — Other Ambulatory Visit: Payer: Self-pay

## 2017-08-11 ENCOUNTER — Encounter (HOSPITAL_COMMUNITY): Payer: Self-pay | Admitting: *Deleted

## 2017-08-11 ENCOUNTER — Ambulatory Visit (HOSPITAL_COMMUNITY)
Admission: RE | Admit: 2017-08-11 | Discharge: 2017-08-11 | Disposition: A | Discharge status: Home / Self Care | Payer: BLUE CROSS/BLUE SHIELD | Source: Ambulatory Visit | Attending: Urology | Admitting: Urology

## 2017-08-11 ENCOUNTER — Encounter (HOSPITAL_COMMUNITY)
Admission: RE | Disposition: A | Discharge status: Home / Self Care | Payer: Self-pay | Source: Ambulatory Visit | Attending: Urology

## 2017-08-11 DIAGNOSIS — N200 Calculus of kidney: Secondary | ICD-10-CM | POA: Insufficient documentation

## 2017-08-11 HISTORY — DX: Cardiac arrhythmia, unspecified: I49.9

## 2017-08-11 HISTORY — DX: Personal history of urinary calculi: Z87.442

## 2017-08-11 HISTORY — PX: EXTRACORPOREAL SHOCK WAVE LITHOTRIPSY: SHX1557

## 2017-08-11 SURGERY — LITHOTRIPSY, ESWL
Anesthesia: LOCAL | Laterality: Right

## 2017-08-11 MED ORDER — SODIUM CHLORIDE 0.9 % IV SOLN
INTRAVENOUS | Status: DC
  Administered 2017-08-11: 07:00:00 via INTRAVENOUS

## 2017-08-11 MED ORDER — CIPROFLOXACIN HCL 500 MG PO TABS
500.0000 mg | ORAL_TABLET | ORAL | Status: AC
  Administered 2017-08-11: 500 mg via ORAL
  Filled 2017-08-11: qty 1

## 2017-08-11 MED ORDER — DIPHENHYDRAMINE HCL 25 MG PO CAPS
25.0000 mg | ORAL_CAPSULE | ORAL | Status: AC
  Administered 2017-08-11: 25 mg via ORAL
  Filled 2017-08-11: qty 1

## 2017-08-11 MED ORDER — DIAZEPAM 5 MG PO TABS
10.0000 mg | ORAL_TABLET | ORAL | Status: AC
  Administered 2017-08-11: 10 mg via ORAL
  Filled 2017-08-11: qty 2

## 2017-08-11 NOTE — Discharge Instructions (Signed)
Lithotripsy, Care After °This sheet gives you information about how to care for yourself after your procedure. Your health care provider may also give you more specific instructions. If you have problems or questions, contact your health care provider. °What can I expect after the procedure? °After the procedure, it is common to have: °· Some blood in your urine. This should only last for a few days. °· Soreness in your back, sides, or upper abdomen for a few days. °· Blotches or bruises on your back where the pressure wave entered the skin. °· Pain, discomfort, or nausea when pieces (fragments) of the kidney stone move through the tube that carries urine from the kidney to the bladder (ureter). Stone fragments may pass soon after the procedure, but they may continue to pass for up to 4-8 weeks. °? If you have severe pain or nausea, contact your health care provider. This may be caused by a large stone that was not broken up, and this may mean that you need more treatment. °· Some pain or discomfort during urination. °· Some pain or discomfort in the lower abdomen or (in men) at the base of the penis. ° °Follow these instructions at home: °Medicines °· Take over-the-counter and prescription medicines only as told by your health care provider. °· If you were prescribed an antibiotic medicine, take it as told by your health care provider. Do not stop taking the antibiotic even if you start to feel better. °· Do not drive for 24 hours if you were given a medicine to help you relax (sedative). °· Do not drive or use heavy machinery while taking prescription pain medicine. °Eating and drinking °· Drink enough water and fluids to keep your urine clear or pale yellow. This helps any remaining pieces of the stone to pass. It can also help prevent new stones from forming. °· Eat plenty of fresh fruits and vegetables. °· Follow instructions from your health care provider about eating and drinking restrictions. You may be  instructed: °? To reduce how much salt (sodium) you eat or drink. Check ingredients and nutrition facts on packaged foods and beverages. °? To reduce how much meat you eat. °· Eat the recommended amount of calcium for your age and gender. Ask your health care provider how much calcium you should have. °General instructions °· Get plenty of rest. °· Most people can resume normal activities 1-2 days after the procedure. Ask your health care provider what activities are safe for you. °· If directed, strain all urine through the strainer that was provided by your health care provider. °? Keep all fragments for your health care provider to see. Any stones that are found may be sent to a medical lab for examination. The stone may be as small as a grain of salt. °· Keep all follow-up visits as told by your health care provider. This is important. °Contact a health care provider if: °· You have pain that is severe or does not get better with medicine. °· You have nausea that is severe or does not go away. °· You have blood in your urine longer than your health care provider told you to expect. °· You have more blood in your urine. °· You have pain during urination that does not go away. °· You urinate more frequently than usual and this does not go away. °· You develop a rash or any other possible signs of an allergic reaction. °Get help right away if: °· You have severe pain in   your back, sides, or upper abdomen. °· You have severe pain while urinating. °· Your urine is very dark red. °· You have blood in your stool (feces). °· You cannot pass any urine at all. °· You feel a strong urge to urinate after emptying your bladder. °· You have a fever or chills. °· You develop shortness of breath, difficulty breathing, or chest pain. °· You have severe nausea that leads to persistent vomiting. °· You faint. °Summary °· After this procedure, it is common to have some pain, discomfort, or nausea when pieces (fragments) of the  kidney stone move through the tube that carries urine from the kidney to the bladder (ureter). If this pain or nausea is severe, however, you should contact your health care provider. °· Most people can resume normal activities 1-2 days after the procedure. Ask your health care provider what activities are safe for you. °· Drink enough water and fluids to keep your urine clear or pale yellow. This helps any remaining pieces of the stone to pass, and it can help prevent new stones from forming. °· If directed, strain your urine and keep all fragments for your health care provider to see. Fragments or stones may be as small as a grain of salt. °· Get help right away if you have severe pain in your back, sides, or upper abdomen or have severe pain while urinating. °This information is not intended to replace advice given to you by your health care provider. Make sure you discuss any questions you have with your health care provider. °Document Released: 03/14/2007 Document Revised: 01/14/2016 Document Reviewed: 01/14/2016 °Elsevier Interactive Patient Education © 2018 [employer] ° °

## 2017-08-12 ENCOUNTER — Encounter (HOSPITAL_COMMUNITY): Payer: Self-pay | Admitting: Urology

## 2017-08-14 ENCOUNTER — Encounter (HOSPITAL_COMMUNITY): Payer: Self-pay | Admitting: Emergency Medicine

## 2017-08-14 ENCOUNTER — Encounter (HOSPITAL_COMMUNITY): Payer: Self-pay | Admitting: Nurse Practitioner

## 2017-08-14 ENCOUNTER — Emergency Department (HOSPITAL_COMMUNITY): Payer: BLUE CROSS/BLUE SHIELD

## 2017-08-14 ENCOUNTER — Other Ambulatory Visit: Payer: Self-pay

## 2017-08-14 ENCOUNTER — Emergency Department (HOSPITAL_COMMUNITY)
Admission: EM | Admit: 2017-08-14 | Discharge: 2017-08-15 | Disposition: A | Discharge status: Home / Self Care | Payer: BLUE CROSS/BLUE SHIELD | Source: Home / Self Care

## 2017-08-14 ENCOUNTER — Emergency Department (HOSPITAL_COMMUNITY)
Admission: EM | Admit: 2017-08-14 | Discharge: 2017-08-14 | Disposition: A | Discharge status: Home / Self Care | Payer: BLUE CROSS/BLUE SHIELD | Attending: Emergency Medicine | Admitting: Emergency Medicine

## 2017-08-14 DIAGNOSIS — Z7982 Long term (current) use of aspirin: Secondary | ICD-10-CM | POA: Diagnosis not present

## 2017-08-14 DIAGNOSIS — Z5321 Procedure and treatment not carried out due to patient leaving prior to being seen by health care provider: Secondary | ICD-10-CM | POA: Insufficient documentation

## 2017-08-14 DIAGNOSIS — R1031 Right lower quadrant pain: Secondary | ICD-10-CM | POA: Insufficient documentation

## 2017-08-14 DIAGNOSIS — N132 Hydronephrosis with renal and ureteral calculous obstruction: Secondary | ICD-10-CM | POA: Diagnosis not present

## 2017-08-14 DIAGNOSIS — N201 Calculus of ureter: Secondary | ICD-10-CM

## 2017-08-14 DIAGNOSIS — K5900 Constipation, unspecified: Secondary | ICD-10-CM

## 2017-08-14 DIAGNOSIS — Z87442 Personal history of urinary calculi: Secondary | ICD-10-CM | POA: Insufficient documentation

## 2017-08-14 DIAGNOSIS — N179 Acute kidney failure, unspecified: Secondary | ICD-10-CM | POA: Diagnosis not present

## 2017-08-14 DIAGNOSIS — R109 Unspecified abdominal pain: Secondary | ICD-10-CM | POA: Diagnosis present

## 2017-08-14 LAB — I-STAT CHEM 8, ED
BUN: 11 mg/dL (ref 6–20)
Calcium, Ion: 1.11 mmol/L — ABNORMAL LOW (ref 1.15–1.40)
Chloride: 101 mmol/L (ref 101–111)
Creatinine, Ser: 1.8 mg/dL — ABNORMAL HIGH (ref 0.61–1.24)
Glucose, Bld: 117 mg/dL — ABNORMAL HIGH (ref 65–99)
HCT: 46 % (ref 39.0–52.0)
Hemoglobin: 15.6 g/dL (ref 13.0–17.0)
Potassium: 3.3 mmol/L — ABNORMAL LOW (ref 3.5–5.1)
Sodium: 140 mmol/L (ref 135–145)
TCO2: 27 mmol/L (ref 22–32)

## 2017-08-14 LAB — CBC WITH DIFFERENTIAL/PLATELET
BASOS ABS: 0 10*3/uL (ref 0.0–0.1)
BASOS PCT: 0 %
EOS ABS: 0.1 10*3/uL (ref 0.0–0.7)
Eosinophils Relative: 0 %
HCT: 43.7 % (ref 39.0–52.0)
HEMOGLOBIN: 15.9 g/dL (ref 13.0–17.0)
LYMPHS ABS: 1.8 10*3/uL (ref 0.7–4.0)
Lymphocytes Relative: 12 %
MCH: 28.2 pg (ref 26.0–34.0)
MCHC: 36.4 g/dL — ABNORMAL HIGH (ref 30.0–36.0)
MCV: 77.5 fL — ABNORMAL LOW (ref 78.0–100.0)
Monocytes Absolute: 1.7 10*3/uL — ABNORMAL HIGH (ref 0.1–1.0)
Monocytes Relative: 11 %
NEUTROS PCT: 77 %
Neutro Abs: 11.9 10*3/uL — ABNORMAL HIGH (ref 1.7–7.7)
Platelets: 171 10*3/uL (ref 150–400)
RBC: 5.64 MIL/uL (ref 4.22–5.81)
RDW: 14 % (ref 11.5–15.5)
WBC: 15.4 10*3/uL — AB (ref 4.0–10.5)

## 2017-08-14 LAB — URINALYSIS, ROUTINE W REFLEX MICROSCOPIC
Bacteria, UA: NONE SEEN
Bilirubin Urine: NEGATIVE
Glucose, UA: NEGATIVE mg/dL
Ketones, ur: NEGATIVE mg/dL
Leukocytes, UA: NEGATIVE
Nitrite: NEGATIVE
Protein, ur: NEGATIVE mg/dL
Specific Gravity, Urine: 1.016 (ref 1.005–1.030)
pH: 5 (ref 5.0–8.0)

## 2017-08-14 MED ORDER — HYDROMORPHONE HCL 1 MG/ML IJ SOLN
1.0000 mg | Freq: Once | INTRAMUSCULAR | Status: AC
  Administered 2017-08-14: 1 mg via INTRAVENOUS
  Filled 2017-08-14: qty 1

## 2017-08-14 MED ORDER — DICYCLOMINE HCL 10 MG/ML IM SOLN
20.0000 mg | Freq: Once | INTRAMUSCULAR | Status: AC
  Administered 2017-08-14: 20 mg via INTRAMUSCULAR
  Filled 2017-08-14: qty 2

## 2017-08-14 MED ORDER — POLYETHYLENE GLYCOL 3350 17 G PO PACK: 17.0000 g | PACK | Freq: Every day | ORAL | 0 refills | Status: AC

## 2017-08-14 MED ORDER — DOCUSATE SODIUM 100 MG PO CAPS: 100.0000 mg | ORAL_CAPSULE | Freq: Two times a day (BID) | ORAL | 0 refills | Status: AC

## 2017-08-14 MED ORDER — SODIUM CHLORIDE 0.9 % IV BOLUS
1000.0000 mL | Freq: Once | INTRAVENOUS | Status: AC
  Administered 2017-08-14: 1000 mL via INTRAVENOUS

## 2017-08-14 MED ORDER — HYDROMORPHONE HCL 2 MG PO TABS: 2.0000 mg | ORAL_TABLET | Freq: Four times a day (QID) | ORAL | 0 refills | Status: DC | PRN

## 2017-08-14 MED ORDER — HYDROMORPHONE HCL 1 MG/ML IJ SOLN
0.5000 mg | Freq: Once | INTRAMUSCULAR | Status: AC
  Administered 2017-08-14: 0.5 mg via INTRAVENOUS
  Filled 2017-08-14: qty 1

## 2017-08-14 NOTE — ED Notes (Signed)
Pt reports having increase in pain. Dr.Pickering notified and advised he will assess CT scan then order additional labs.

## 2017-08-14 NOTE — ED Triage Notes (Signed)
Pt is c/o RLQ abdominal pain that he states is associated with constipation, he is requesting to be re-evaluated from earlier visit.

## 2017-08-14 NOTE — ED Provider Notes (Signed)
[facility]-EMERGENCY DEPT Provider Note   CSN: [id number] Arrival date & time: 08/14/17  0020     History   Chief Complaint Chief Complaint  Patient presents with  . Abdominal Pain    HPI Juan Mcintyre is a 52 y.o. male.  HPI Patient presents after lithotripsy 3 days ago.  No bowel movement in around 4 days.  No relief of suppository.  Today developed more severe cramping pain.  Severe on the right side.  States feels different than the kidney stones.  The lithotripsy was on the right side.  No dysuria.  No fevers.  Some nausea but no vomiting.  Pain is crampy. Past Medical History:  Diagnosis Date  . A-fib (HCC)   . Acute low back pain   . Dysrhythmia     a fib  . Hematuria   . History of Helicobacter pylori infection   . History of kidney stones   . Sickle cell trait [facility])     Patient Active Problem List   Diagnosis Date Noted  . Penile fracture 12/06/2014  . DIZZINESS OR VERTIGO 12/05/2006    Past Surgical History:  Procedure Laterality Date  . EXTRACORPOREAL SHOCK WAVE LITHOTRIPSY Right 08/11/2017   Procedure: RIGHT EXTRACORPOREAL SHOCK WAVE LITHOTRIPSY (ESWL);  Surgeon: Malen Gauze, MD;  Location: [facility] ORS;  Service: Urology;  Laterality: Right;  . LITHOTRIPSY  2019  . OTHER SURGICAL HISTORY   07/2011   endo  . REPAIR OF FRACTURED PENIS N/A 12/06/2014   Procedure: REPAIR OF FRACTURED PENIS;  Surgeon: Bjorn Pippin, MD;  Location: [facility];  Service: Urology;  Laterality: N/A;        Home Medications    Prior to Admission medications   Medication Sig Start Date End Date Taking? Authorizing Provider  aspirin 81 MG chewable tablet Chew 81 mg by mouth daily.   Yes [provider]  ondansetron (ZOFRAN) 8 MG tablet Take 8 mg by mouth every 8 (eight) hours as needed for nausea or vomiting.   Yes [provider]  oxyCODONE-acetaminophen (PERCOCET/ROXICET) 5-325 MG tablet Take 1 tablet by mouth every 4 (four) hours as needed  for severe pain.   Yes [provider]  tamsulosin (FLOMAX) 0.4 MG CAPS capsule Take 0.4 mg by mouth daily.   Yes [provider]  docusate sodium (COLACE) 100 MG capsule Take 1 capsule (100 mg total) by mouth every 12 (twelve) hours. 08/14/17   Benjiman Core, MD  HYDROcodone-acetaminophen (NORCO/VICODIN) 5-325 MG tablet Take 1-2 tablets by mouth every 4 (four) hours as needed for moderate pain. Patient not taking: Reported on 08/30/2015 12/07/14   Bjorn Pippin, MD  HYDROmorphone (DILAUDID) 2 MG tablet Take 1 tablet (2 mg total) by mouth every 6 (six) hours as needed for severe pain. 08/14/17   Benjiman Core, MD  polyethylene glycol [facility]) packet Take 17 g by mouth daily. 08/14/17   Benjiman Core, MD    Family History Family History  Problem Relation Age of Onset  . Hypertension Mother   . Alzheimer's disease Father     Social History Social History   Tobacco Use  . Smoking status: Never Smoker  . Smokeless tobacco: Never Used  Substance Use Topics  . Alcohol use: No  . Drug use: No     Allergies   Patient has no known allergies.   Review of Systems Review of Systems  Constitutional: Negative for appetite change.  HENT: Negative for congestion.   Respiratory: Negative for shortness  of breath.   Cardiovascular: Negative for chest pain.  Gastrointestinal: Positive for abdominal pain and nausea.  Endocrine: Negative for polyuria.  Genitourinary: Negative for dysuria and flank pain.  Musculoskeletal: Negative for back pain.  Skin: Negative for rash.  Neurological: Negative for seizures and weakness.  Psychiatric/Behavioral: Negative for confusion.     Physical Exam Updated Vital Signs BP (!) 140/96 (BP Location: Left Arm)   Pulse 72   Temp 99.8 F (37.7 C) (Oral)   Resp 15   Ht 6' (1.829 m)   Wt 95.3 kg (210 lb)   SpO2 96%   BMI 28.48 kg/m   Physical Exam  Constitutional: He appears well-developed.  HENT:  Head: Normocephalic.    Eyes: EOM are normal.  Cardiovascular: Normal rate.  Pulmonary/Chest: Breath sounds normal.  Abdominal: Normal appearance.  Mild right-sided abdominal tenderness without rebound or guarding.  Genitourinary:  Genitourinary Comments: No CVA tenderness  Neurological: He is alert.  Skin: Skin is warm. Capillary refill takes less than 2 seconds.     ED Treatments / Results  Labs (all labs ordered are listed, but only abnormal results are displayed) Labs Reviewed  CBC WITH DIFFERENTIAL/PLATELET - Abnormal; Notable for the following components:      Result Value   WBC 15.4 (*)    MCV 77.5 (*)    MCHC 36.4 (*)    Neutro Abs 11.9 (*)    Monocytes Absolute 1.7 (*)    All other components within normal limits  URINALYSIS, ROUTINE W REFLEX MICROSCOPIC - Abnormal; Notable for the following components:   Hgb urine dipstick LARGE (*)    All other components within normal limits  I-STAT CHEM 8, ED - Abnormal; Notable for the following components:   Potassium 3.3 (*)    Creatinine, Ser 1.80 (*)    Glucose, Bld 117 (*)    Calcium, Ion 1.11 (*)    All other components within normal limits    EKG None  Radiology Dg Abdomen 1 View  Result Date: 08/14/2017 CLINICAL DATA:  Status post lithotripsy. Worsening abdominal pain and constipation. EXAM: ABDOMEN - 1 VIEW COMPARISON:  Abdominal radiograph performed 08/11/2017, and CT of the abdomen and pelvis performed 07/03/2013 FINDINGS: The previously noted stone overlying the right kidney is now seen distally, overlying the right vesicoureteral junction. The visualized bowel gas pattern is unremarkable. A moderate amount of stool is noted in the ascending colon; no abnormal dilatation of small bowel loops is seen to suggest small bowel obstruction. No free intra-abdominal air is identified, though evaluation for free air is limited on a single supine view. The visualized osseous structures are within normal limits; the sacroiliac joints are unremarkable  in appearance. IMPRESSION: 1. Previously noted stone overlying the right kidney is now seen distally, overlying the right vesicoureteral junction. 2. Moderate amount of stool noted in the ascending colon, without definite radiographic evidence of constipation. Electronically Signed   By: Roanna RaiderJeffery  Chang M.D.   On: 08/14/2017 01:52   Ct Renal Stone Study  Result Date: 08/14/2017 CLINICAL DATA:  Acute onset of generalized abdominal pain. Lack of bowel movements. Recent lithotripsy. EXAM: CT ABDOMEN AND PELVIS WITHOUT CONTRAST TECHNIQUE: Multidetector CT imaging of the abdomen and pelvis was performed following the standard protocol without IV contrast. COMPARISON:  CT of the abdomen and pelvis performed 07/03/2013, and MRI of the abdomen performed 04/26/2017 FINDINGS: Lower chest: Mild right basilar atelectasis or scarring is noted. The visualized portions of the mediastinum are unremarkable. Hepatobiliary: The liver is  unremarkable in appearance. The gallbladder is unremarkable in appearance. The common bile duct remains normal in caliber. Pancreas: The pancreas is within normal limits. Spleen: The spleen is unremarkable in appearance. Adrenals/Urinary Tract: The adrenal glands are unremarkable. Moderate right-sided hydronephrosis is noted, reflecting obstructing 4 mm and 2 mm stones at the distal right ureter, just above the right vesicoureteral junction. Right-sided perinephric stranding is noted. Mild left-sided hydronephrosis appears to reflect a stricture proximally at the left ureteropelvic junction. A few tiny nonobstructing right renal stones are seen. Stomach/Bowel: The stomach is unremarkable in appearance. The small bowel is within normal limits. The appendix is normal in caliber, without evidence of appendicitis. The colon is unremarkable in appearance. Vascular/Lymphatic: The abdominal aorta is unremarkable in appearance. The inferior vena cava is grossly unremarkable. No retroperitoneal  lymphadenopathy is seen. No pelvic sidewall lymphadenopathy is identified. Reproductive: The bladder is mildly distended and grossly unremarkable. The prostate is normal in size. Other: No additional soft tissue abnormalities are seen. Musculoskeletal: No acute osseous abnormalities are identified. Mild facet disease is noted at the lower lumbar spine. The visualized musculature is unremarkable in appearance. IMPRESSION: 1. Moderate right-sided hydronephrosis, reflecting obstructing 4 mm and 2 mm stones at the distal right ureter, just above the right vesicoureteral junction. 2. Mild left-sided hydronephrosis appears to reflect a stricture proximally at the left ureteropelvic junction. 3. Few tiny nonobstructing right renal stones noted. 4. Mild right basilar atelectasis or scarring noted. Electronically Signed   By: Roanna Raider M.D.   On: 08/14/2017 03:29    Procedures Procedures (including critical care time)  Medications Ordered in ED Medications  dicyclomine (BENTYL) injection 20 mg (20 mg Intramuscular Given 08/14/17 0143)  HYDROmorphone (DILAUDID) injection 0.5 mg (0.5 mg Intravenous Given 08/14/17 0354)  HYDROmorphone (DILAUDID) injection 1 mg (1 mg Intravenous Given 08/14/17 0430)  sodium chloride 0.9 % bolus 1,000 mL (0 mLs Intravenous Stopped 08/14/17 1610)     Initial Impression / Assessment and Plan / ED Course  I have reviewed the triage vital signs and the nursing notes.  Pertinent labs & imaging results that were available during my care of the patient were reviewed by me and considered in my medical decision making (see chart for details).     Patient with right-sided flank pain.  Patient thought it was due to constipation but may be due to his ureteral stones now being obstructed.  However creatinine is increased.  Discussed with Dr. Liliane Shi.  Does have mild hydronephrosis on the other side.  Fluid bolus given.  Will give more pain medicines and medication to keep his bowels moving.   We will follow-up with Dr. Thea Silversmith.  Final Clinical Impressions(s) / ED Diagnoses   Final diagnoses:  Right ureteral stone  AKI (acute kidney injury) [facility])    ED Discharge Orders        Ordered    polyethylene glycol (MIRALAX) packet  Daily     08/14/17 0614    docusate sodium (COLACE) 100 MG capsule  Every 12 hours     08/14/17 0614    HYDROmorphone (DILAUDID) 2 MG tablet  Every 6 hours PRN     08/14/17 [id number]       Benjiman Core, MD 08/14/17 [phone]

## 2017-08-14 NOTE — ED Notes (Signed)
Pt instructed on need for urine sample. Pt advised he did not have to urinate at this time.

## 2017-08-14 NOTE — ED Triage Notes (Signed)
Pt arriving with abdominal pain. Pt had lithotripsy on Thursday. Pt reports not having bowel movement since last Wednesday. PT took suppository last night with no relief.

## 2017-08-14 NOTE — ED Notes (Signed)
Bed: WA13 Expected date:  Expected time:  Means of arrival:  Comments: resus b 

## 2017-08-15 NOTE — ED Notes (Signed)
No answer when called for room. Not found in lobby.

## 2017-08-15 NOTE — ED Notes (Signed)
No answer when called for room 

## 2017-08-17 ENCOUNTER — Other Ambulatory Visit: Payer: Self-pay | Admitting: Urology

## 2017-08-17 ENCOUNTER — Ambulatory Visit (HOSPITAL_COMMUNITY): Payer: BLUE CROSS/BLUE SHIELD | Admitting: Certified Registered Nurse Anesthetist

## 2017-08-17 ENCOUNTER — Encounter (HOSPITAL_COMMUNITY)
Admission: RE | Disposition: A | Discharge status: Home / Self Care | Payer: Self-pay | Source: Ambulatory Visit | Attending: Urology

## 2017-08-17 ENCOUNTER — Encounter (HOSPITAL_COMMUNITY): Payer: Self-pay | Admitting: *Deleted

## 2017-08-17 ENCOUNTER — Ambulatory Visit (HOSPITAL_COMMUNITY): Payer: BLUE CROSS/BLUE SHIELD

## 2017-08-17 ENCOUNTER — Ambulatory Visit (HOSPITAL_COMMUNITY)
Admission: RE | Admit: 2017-08-17 | Discharge: 2017-08-17 | Disposition: A | Discharge status: Home / Self Care | Payer: BLUE CROSS/BLUE SHIELD | Source: Ambulatory Visit | Attending: Urology | Admitting: Urology

## 2017-08-17 ENCOUNTER — Other Ambulatory Visit: Payer: Self-pay

## 2017-08-17 DIAGNOSIS — N132 Hydronephrosis with renal and ureteral calculous obstruction: Secondary | ICD-10-CM | POA: Insufficient documentation

## 2017-08-17 DIAGNOSIS — Z79899 Other long term (current) drug therapy: Secondary | ICD-10-CM | POA: Insufficient documentation

## 2017-08-17 DIAGNOSIS — K5900 Constipation, unspecified: Secondary | ICD-10-CM | POA: Diagnosis not present

## 2017-08-17 DIAGNOSIS — N200 Calculus of kidney: Secondary | ICD-10-CM | POA: Diagnosis present

## 2017-08-17 DIAGNOSIS — Z87442 Personal history of urinary calculi: Secondary | ICD-10-CM | POA: Insufficient documentation

## 2017-08-17 HISTORY — PX: CYSTOSCOPY WITH RETROGRADE PYELOGRAM, URETEROSCOPY AND STENT PLACEMENT: SHX5789

## 2017-08-17 HISTORY — PX: HOLMIUM LASER APPLICATION: SHX5852

## 2017-08-17 SURGERY — CYSTOURETEROSCOPY, WITH RETROGRADE PYELOGRAM AND STENT INSERTION
Anesthesia: General | Site: Ureter | Laterality: Right

## 2017-08-17 MED ORDER — FENTANYL CITRATE (PF) 250 MCG/5ML IJ SOLN
INTRAMUSCULAR | Status: AC
  Filled 2017-08-17: qty 5

## 2017-08-17 MED ORDER — LIDOCAINE 2% (20 MG/ML) 5 ML SYRINGE
INTRAMUSCULAR | Status: DC | PRN
  Administered 2017-08-17: 60 mg via INTRAVENOUS

## 2017-08-17 MED ORDER — PROPOFOL 10 MG/ML IV BOLUS
INTRAVENOUS | Status: AC
  Filled 2017-08-17: qty 40

## 2017-08-17 MED ORDER — ONDANSETRON HCL 4 MG/2ML IJ SOLN
INTRAMUSCULAR | Status: AC
  Filled 2017-08-17: qty 2

## 2017-08-17 MED ORDER — LACTATED RINGERS IV SOLN
INTRAVENOUS | Status: DC
  Administered 2017-08-17 (×2): via INTRAVENOUS

## 2017-08-17 MED ORDER — CEFAZOLIN SODIUM-DEXTROSE 2-4 GM/100ML-% IV SOLN
INTRAVENOUS | Status: AC
  Filled 2017-08-17: qty 100

## 2017-08-17 MED ORDER — MIDAZOLAM HCL 2 MG/2ML IJ SOLN
INTRAMUSCULAR | Status: AC
  Filled 2017-08-17: qty 2

## 2017-08-17 MED ORDER — ONDANSETRON HCL 4 MG/2ML IJ SOLN: 4.0000 mg | Freq: Once | INTRAMUSCULAR | Status: DC | PRN

## 2017-08-17 MED ORDER — MIDAZOLAM HCL 5 MG/5ML IJ SOLN
INTRAMUSCULAR | Status: DC | PRN
  Administered 2017-08-17: 2 mg via INTRAVENOUS

## 2017-08-17 MED ORDER — DEXAMETHASONE SODIUM PHOSPHATE 10 MG/ML IJ SOLN
INTRAMUSCULAR | Status: DC | PRN
  Administered 2017-08-17: 10 mg via INTRAVENOUS

## 2017-08-17 MED ORDER — HYDROMORPHONE HCL 1 MG/ML IJ SOLN: 0.2500 mg | INTRAMUSCULAR | Status: DC | PRN

## 2017-08-17 MED ORDER — LIDOCAINE 2% (20 MG/ML) 5 ML SYRINGE
INTRAMUSCULAR | Status: AC
  Filled 2017-08-17: qty 5

## 2017-08-17 MED ORDER — SODIUM CHLORIDE 0.9 % IR SOLN
Status: DC | PRN
  Administered 2017-08-17: 3000 mL

## 2017-08-17 MED ORDER — OXYCODONE HCL 5 MG PO CAPS: 5.0000 mg | ORAL_CAPSULE | ORAL | 0 refills | Status: AC | PRN

## 2017-08-17 MED ORDER — PROPOFOL 10 MG/ML IV BOLUS
INTRAVENOUS | Status: DC | PRN
  Administered 2017-08-17: 250 mg via INTRAVENOUS

## 2017-08-17 MED ORDER — IOHEXOL 300 MG/ML  SOLN
INTRAMUSCULAR | Status: DC | PRN
  Administered 2017-08-17: 20 mL

## 2017-08-17 MED ORDER — ONDANSETRON HCL 4 MG/2ML IJ SOLN
INTRAMUSCULAR | Status: DC | PRN
  Administered 2017-08-17: 4 mg via INTRAVENOUS

## 2017-08-17 MED ORDER — SODIUM CHLORIDE 0.9 % IR SOLN
Status: DC | PRN
  Administered 2017-08-17: 1000 mL

## 2017-08-17 MED ORDER — DEXAMETHASONE SODIUM PHOSPHATE 10 MG/ML IJ SOLN
INTRAMUSCULAR | Status: AC
  Filled 2017-08-17: qty 1

## 2017-08-17 MED ORDER — MEPERIDINE HCL 50 MG/ML IJ SOLN: 6.2500 mg | INTRAMUSCULAR | Status: DC | PRN

## 2017-08-17 MED ORDER — FENTANYL CITRATE (PF) 100 MCG/2ML IJ SOLN
INTRAMUSCULAR | Status: DC | PRN
  Administered 2017-08-17: 100 ug via INTRAVENOUS

## 2017-08-17 MED ORDER — CEFAZOLIN SODIUM-DEXTROSE 2-4 GM/100ML-% IV SOLN
2.0000 g | Freq: Once | INTRAVENOUS | Status: AC
  Administered 2017-08-17: 2 g via INTRAVENOUS

## 2017-08-17 SURGICAL SUPPLY — 26 items
BAG URO CATCHER STRL LF (MISCELLANEOUS) ×4 IMPLANT
BASKET ZERO TIP NITINOL 2.4FR (BASKET) IMPLANT
CATH FOLEY 2WAY SLVR  5CC 16FR (CATHETERS)
CATH FOLEY 2WAY SLVR 5CC 16FR (CATHETERS) IMPLANT
CATH INTERMIT  6FR 70CM (CATHETERS) ×4 IMPLANT
CATH URET 5FR 28IN CONE TIP (BALLOONS)
CATH URET 5FR 70CM CONE TIP (BALLOONS) IMPLANT
CATH URET DUAL LUMEN 6-10FR 50 (CATHETERS) ×4 IMPLANT
CATH URET WHISTLE 6FR (CATHETERS) IMPLANT
CLOTH BEACON ORANGE TIMEOUT ST (SAFETY) ×4 IMPLANT
COVER FOOTSWITCH UNIV (MISCELLANEOUS) IMPLANT
COVER SURGICAL LIGHT HANDLE (MISCELLANEOUS) ×4 IMPLANT
FIBER LASER TRAC TIP (UROLOGICAL SUPPLIES) ×4 IMPLANT
GLOVE BIO SURGEON STRL SZ7.5 (GLOVE) ×4 IMPLANT
GOWN STRL REUS W/TWL XL LVL3 (GOWN DISPOSABLE) ×4 IMPLANT
GUIDEWIRE STR DUAL SENSOR (WIRE) ×8 IMPLANT
MANIFOLD NEPTUNE II (INSTRUMENTS) ×4 IMPLANT
PACK CYSTO (CUSTOM PROCEDURE TRAY) ×4 IMPLANT
SHEATH URETERAL 12FRX28CM (UROLOGICAL SUPPLIES) IMPLANT
SHEATH URETERAL 12FRX35CM (MISCELLANEOUS) IMPLANT
STENT CONTOUR 6FRX26X.038 (STENTS) ×4 IMPLANT
SYR 10ML LL (SYRINGE) ×4 IMPLANT
TUBING CONNECTING 10 (TUBING) ×3 IMPLANT
TUBING CONNECTING 10' (TUBING) ×1
TUBING UROLOGY SET (TUBING) ×4 IMPLANT
WIRE COONS/BENSON .038X145CM (WIRE) IMPLANT

## 2017-08-17 NOTE — Transfer of Care (Signed)
Immediate Anesthesia Transfer of Care Note  Patient: Juan BastosKen G Mcintyre  Procedure(s) Performed: CYSTOSCOPY WITH RETROGRADE PYELOGRAM, URETEROSCOPY,  AND STENT PLACEMENT (Right Ureter) HOLMIUM LASER APPLICATION (Right )  Patient Location: PACU  Anesthesia Type:General  Level of Consciousness: drowsy and patient cooperative  Airway & Oxygen Therapy: Patient Spontanous Breathing and Patient connected to face mask oxygen  Post-op Assessment: Report given to RN and Post -op Vital signs reviewed and stable  Post vital signs: Reviewed and stable  Last Vitals:  Vitals Value Taken Time  BP 159/106 08/17/2017  9:38 PM  Temp 36.9 C 08/17/2017  9:38 PM  Pulse 83 08/17/2017  9:41 PM  Resp 24 08/17/2017  9:41 PM  SpO2 99 % 08/17/2017  9:41 PM  Vitals shown include unvalidated device data.  Last Pain:  Vitals:   08/17/17 1925  TempSrc:   PainSc: 0-No pain         Complications: No apparent anesthesia complications

## 2017-08-17 NOTE — Interval H&P Note (Signed)
History and Physical Interval Note:  08/17/2017 8:27 PM  Juan Mcintyre  has presented today for surgery, with the diagnosis of right ureteral calculus  The various methods of treatment have been discussed with the patient and family. After consideration of risks, benefits and other options for treatment, the patient has consented to  Procedure(s): CYSTOSCOPY WITH RETROGRADE PYELOGRAM, URETEROSCOPY, HOLMIUM LASER LITHOTRIPSY,  AND STENT PLACEMENT (Right) as a surgical intervention. The patient's history has been reviewed, patient examined, no change in status, stable for surgery. I discussed with the patient the nature, potential benefits, risks and alternatives to cystoscopy, right URS, laser litho, stent, including side effects of the proposed treatment, the likelihood of the patient achieving the goals of the procedure, and any potential problems that might occur during the procedure or recuperation. Pt said he's had a lot of pain and not eating.  All questions answered. Patient elects to proceed. Discussed he may need a staged procedure. I have reviewed the patient's chart, imaging and labs.  Questions were answered to the patient's satisfaction.     Jerilee FieldMatthew Dietrich Samuelson

## 2017-08-17 NOTE — Anesthesia Preprocedure Evaluation (Signed)
Anesthesia Evaluation  Patient identified by MRN, date of birth, ID band Patient awake    Reviewed: Allergy & Precautions, NPO status , Patient's Chart, lab work & pertinent test results  Airway Mallampati: I  TM Distance: >3 FB Neck ROM: Full    Dental   Pulmonary    Pulmonary exam normal        Cardiovascular Normal cardiovascular exam+ dysrhythmias Atrial Fibrillation      Neuro/Psych    GI/Hepatic   Endo/Other    Renal/GU      Musculoskeletal   Abdominal   Peds  Hematology   Anesthesia Other Findings   Reproductive/Obstetrics                             Anesthesia Physical Anesthesia Plan  ASA: III  Anesthesia Plan: General   Post-op Pain Management:    Induction: Intravenous  PONV Risk Score and Plan: 2 and Ondansetron and Treatment may vary due to age or medical condition  Airway Management Planned: LMA  Additional Equipment:   Intra-op Plan:   Post-operative Plan: Extubation in OR  Informed Consent: I have reviewed the patients History and Physical, chart, labs and discussed the procedure including the risks, benefits and alternatives for the proposed anesthesia with the patient or authorized representative who has indicated his/her understanding and acceptance.     Plan Discussed with: CRNA and Surgeon  Anesthesia Plan Comments:         Anesthesia Quick Evaluation

## 2017-08-17 NOTE — Op Note (Addendum)
Preoperative Diagnosis: Right nephrolithiasis  Postoperative Diagnosis:  Same  Procedure(s) Performed:   - Cystourethroscopy - Right ureteroscopic stone extraction with laser lithotripsy - Right retrograde pyelogram - Right ureteral stent placement without dangler  - Intraoperative fluoroscopy with interpretation <1hr.   Teaching Surgeon: Jerilee Field, MD  Resident Surgeon:  Sydnee Levans, MD, MD  Assistant(s):  None  Anesthesia:  General  Fluids:  See anesthesia record  Estimated blood loss:  0cc  Specimens:  Stone for analysis  Drains:  Right 6Fr x 24cm JJ ureteral stent without dangler  Complications:  None  Indications: 52 y.o. patient with a history of 2 distal obstructing ureteral stones with elevated Cr and persistent pain. Risks & benefits of the procedure discussed with the patient, who wishes to proceed.  Findings:   - Normal urethral and prostate, ureteral orifices orthotopic but close to bladder neck  - Concentric narrowing in distal ureter, unable to pass 16fr dual lumen catheter  - Two stones found in distal ureter, dusted to fine grit  - Uncomplicated stent placement, 14fr x 24cm JJ ureteral stent without dangler.   Radiologic Interpretation of Retrograde Pyelogram: Right retrograde pyelogram demonstrated filling defect in distal ureter consistent with obstructing stones. Post treatment RPG showed resolution of filling defect, no contrast extravasation, filling defects or evidence of hydroureteronephrosis.  Description:  The patient was correctly identified in the preop holding area where written informed consent as well potential risk and complication reviewed. The patient agreed. They were brought back to the operative suite where a preinduction timeout was performed. Once correct information was verified, general anesthesia was induced. They were then gently placed into dorsal lithotomy position with SCDs in place for VTE prophylaxis. They were prepped  and draped in the usual sterile fashion and given appropriate preoperative antibiotics. A second timeout was then performed.   We inserted a 6F rigid cystoscope per urethra with copious lubrication and normal saline irrigation running. This demonstrated findings as described above.     We cannulated the right ureteral orifice with a 5Fr open ended catheter. Using full strength contrast a retrograde pyelogram was performed with findings as noted above. We then placed a sensor wire into the right kidney    Next we performed semirigid ureteroscopy along side the wire. At first we were unable to pass the semirigid scope past the distal stricture. We attempted to pass a 26fr dual lumen catheter to gently dilate the stricture, but this would not pass. We then passed the single channel 4.5Fr semirigid and even this wouldn't pass. So we used a sensor wire to rail road through the distal stricture without difficulty. The 6Fr open ended had passed, so the wire was needed to straighten it just enough to allow the needle pt to pass. We then encountered the two stones in the distal ureter. These were dusted with a 200 micron fiber. Once all fragments were dusted into less than 1mm fragments we performed a repeat RPG with the above noted findings.    At this point we elected to leave a ureteral stent and withdrew our instruments leaving a sensor wire in place. We then advanced a 6Fr x 26cm JJ ureteral stent without dangler with the assistance of a stent pusher under direct fluoroscopic without difficultly. Sensor wire removal demonstrated satisfactory stent curl proximally in the renal pelvis and distally in the bladder. The bladder was emptied and all instrumentation was removed. The stent string was cut to an appropriate length. The patient was woken  up from anesthesia and taken to the recovery unit for routine postoperative care.   Post Op Plan:   1. Discharge home when patient meets PACU criteria  2. Follow  up for stent removal in 10-14 days (task sent)    Attestation:  Dr. Mena GoesEskridge was present and scrubbed for the entire procedure.

## 2017-08-17 NOTE — Anesthesia Postprocedure Evaluation (Signed)
Anesthesia Post Note  Patient: Jethro BastosKen G Alfieri  Procedure(s) Performed: CYSTOSCOPY WITH RETROGRADE PYELOGRAM, URETEROSCOPY,  AND STENT PLACEMENT (Right Ureter) HOLMIUM LASER APPLICATION (Right )     Patient location during evaluation: PACU Anesthesia Type: General Level of consciousness: awake and alert Pain management: pain level controlled Vital Signs Assessment: post-procedure vital signs reviewed and stable Respiratory status: spontaneous breathing, nonlabored ventilation, respiratory function stable and patient connected to nasal cannula oxygen Cardiovascular status: blood pressure returned to baseline and stable Postop Assessment: no apparent nausea or vomiting Anesthetic complications: no    Last Vitals:  Vitals:   08/17/17 1925 08/17/17 2138  BP: (!) 154/93 (!) 159/106  Pulse: 63 90  Resp: 18 (!) 25  Temp:  36.9 C  SpO2: 98% 98%    Last Pain:  Vitals:   08/17/17 1925  TempSrc:   PainSc: 0-No pain                 Findlay Dagher DAVID

## 2017-08-17 NOTE — Discharge Instructions (Signed)
General Anesthesia, Adult, Care After These instructions provide you with information about caring for yourself after your procedure. Your health care provider may also give you more specific instructions. Your treatment has been planned according to current medical practices, but problems sometimes occur. Call your health care provider if you have any problems or questions after your procedure. What can I expect after the procedure? After the procedure, it is common to have:  Vomiting.  A sore throat.  Mental slowness.  It is common to feel:  Nauseous.  Cold or shivery.  Sleepy.  Tired.  Sore or achy, even in parts of your body where you did not have surgery.  Follow these instructions at home: For at least 24 hours after the procedure:  Do not: ? Participate in activities where you could fall or become injured. ? Drive. ? Use heavy machinery. ? Drink alcohol. ? Take sleeping pills or medicines that cause drowsiness. ? Make important decisions or sign legal documents. ? Take care of children on your own.  Rest. Eating and drinking  If you vomit, drink water, juice, or soup when you can drink without vomiting.  Drink enough fluid to keep your urine clear or pale yellow.  Make sure you have little or no nausea before eating solid foods.  Follow the diet recommended by your health care provider. General instructions  Have a responsible adult stay with you until you are awake and alert.  Return to your normal activities as told by your health care provider. Ask your health care provider what activities are safe for you.  Take over-the-counter and prescription medicines only as told by your health care provider.  If you smoke, do not smoke without supervision.  Keep all follow-up visits as told by your health care provider. This is important. Contact a health care provider if:  You continue to have nausea or vomiting at home, and medicines are not helpful.  You  cannot drink fluids or start eating again.  You cannot urinate after 8-12 hours.  You develop a skin rash.  You have fever.  You have increasing redness at the site of your procedure. Get help right away if: Ureteral Stent Implantation, Care After Refer to this sheet in the next few weeks. These instructions provide you with information about caring for yourself after your procedure. Your health care provider may also give you more specific instructions. Your treatment has been planned according to current medical practices, but problems sometimes occur. Call your health care provider if you have any problems or questions after your procedure.  Removal of the stent: Be sure to follow-up with [facility] to have the stent removed in 10-14 days  What can I expect after the procedure? After the procedure, it is common to have:  Nausea.  Mild pain when you urinate. You may feel this pain in your lower back or lower abdomen. Pain should stop within a few minutes after you urinate. This may last for up to 1 week.  A small amount of blood in your urine for several days.  Follow these instructions at home:  Medicines  Take over-the-counter and prescription medicines only as told by your health care provider.  If you were prescribed an antibiotic medicine, take it as told by your health care provider. Do not stop taking the antibiotic even if you start to feel better.  Do not drive for 24 hours if you received a sedative.  Do not drive or operate heavy machinery while taking  prescription pain medicines. Activity  Return to your normal activities as told by your health care provider. Ask your health care provider what activities are safe for you.  Do not lift anything that is heavier than 10 lb (4.5 kg). Follow this limit for 1 week after your procedure, or for as long as told by your health care provider. General instructions  Watch for any blood in your urine. Call your health  care provider if the amount of blood in your urine increases.  If you have a catheter: ? Follow instructions from your health care provider about taking care of your catheter and collection bag. ? Do not take baths, swim, or use a hot tub until your health care provider approves.  Drink enough fluid to keep your urine clear or pale yellow.  Keep all follow-up visits as told by your health care provider. This is important. Contact a health care provider if:  You have pain that gets worse or does not get better with medicine, especially pain when you urinate.  You have difficulty urinating.  You feel nauseous or you vomit repeatedly during a period of more than 2 days after the procedure. Get help right away if:  Your urine is dark red or has blood clots in it.  You are leaking urine (have incontinence).  The end of the stent comes out of your urethra.  You cannot urinate.  You have sudden, sharp, or severe pain in your abdomen or lower back.  You have a fever. This information is not intended to replace advice given to you by your health care provider. Make sure you discuss any questions you have with your health care provider. Document Released: 10/25/2012 Document Revised: 07/31/2015 Document Reviewed: 09/06/2014 Elsevier Interactive Patient Education  2018 [employer].   You have difficulty breathing.  You have chest pain.  You have unexpected bleeding.  You feel that you are having a life-threatening or urgent problem. This information is not intended to replace advice given to you by your health care provider. Make sure you discuss any questions you have with your health care provider. Document Released: 05/31/2000 Document Revised: 07/28/2015 Document Reviewed: 02/06/2015 Elsevier Interactive Patient Education  [employer].

## 2017-08-17 NOTE — H&P (Signed)
Office Visit Report     08/16/2017    Armandina Stammer         MRN: [id number]  PRIMARY CARE:  Debara Pickett. Ricard Dillon, NP  DOB: [birth year], 52 year old Male  REFERRING:  Cammie Fulp- No longer in Practice, MD  SSN: -[id number]  PROVIDER:  Franchot Gallo, M.D.    TREATING:  Azucena Fallen    LOCATION:  [facility] [phone]    CC: I have kidney stones.(Surgery)  HPI: Juan Mcintyre is a 52 year-old male established patient who is here for renal calculi after a surgical intervention.  08/16/17: Patient is s/p Right ESWL on 08/11/17. He was seen seen at the [facility] ED for constipation (no BM for 4 days) and abdominal/flank pain. CTSS showed two residual stones at the right UVJ w/ hydro. Creatinine 1.8. AFVSS. UA clear. Pain improved w/ dose of dilaudid. Bolused 2 L of IVF. He returns today for follow up. He states that initially following his procedure, he did see some stones pass; however, denies seeing any stones pass following recent ED visit. He continues to complain right flank and lower back pain. He describes the pain as sharp and intermittent. It does not radiate. Currently pain is rated at 7/10. He last used dose of Hydromorphone at 8 am this morning. He denies any current nausea. He denies exacerbation of voiding symptoms. He dysuria, gross hematuria, fever, or chills. He denies difficulties voiding. He continues to complain of constipation and has not had a bowel movement for one week.    The problem is on the right side. He had eswl for treatment of his renal calculi. Patient denies stent, ureteroscopy, and percutaneous lithotomy. This procedure was done 08/11/2017.   ALLERGIES: No Allergies   MEDICATIONS: Morphine Sulfate  No Reported Medications    GU PSH: ESWL - 08/11/2017 Repair Penis - 2016     PSH Notes: Surg Penis Plastic Operation For Injury, No Surgical Problems, No Surgical Problems   NON-GU PSH: None   GU PMH: Microscopic hematuria - 06/09/2017 Flank Pain -  06/02/2017 Renal calculus - 06/02/2017 Other microscopic hematuria, Microscopic hematuria - 2014 Unil Inguinal Hernia W/O obst or gang,non-recurrent, Inguinal Hernia - 2014     PMH Notes:  2014-12-09 09:03:16 - Note: No pertinent past medical history  Sickle cell traight   NON-GU PMH: Fracture of corpus cavernosum penis, initial encounter, Penile fracture - 2016 Encounter for general adult medical examination without abnormal findings, Encounter for preventive health examination - 2016   FAMILY HISTORY: Hypertension - Mother   SOCIAL HISTORY: Marital Status: Married Preferred Language: English; Ethnicity: Not Hispanic Or Latino; Race: Black or African American Current Smoking Status: Patient has never smoked.   Tobacco Use Assessment Completed:  Used Tobacco in last 30 days?  Does not drink anymore.  Does not drink caffeine. Patient's occupation Research scientist (physical sciences).    Notes: Caffeine use, Never a smoker, Alcohol use, Number of children, Caffeine Use, Never A Smoker, Occupation:, Marital History - Currently Married, Alcohol Use   REVIEW OF SYSTEMS:    GU Review Male:   Patient denies frequent urination, hard to postpone urination, burning/ pain with urination, get up at night to urinate, leakage of urine, stream starts and stops, trouble starting your stream, have to strain to urinate , erection problems, and penile pain.  Gastrointestinal (Upper):   Patient denies nausea, vomiting, and indigestion/ heartburn.  Gastrointestinal (Lower):   Patient denies diarrhea and constipation.  Constitutional:   Patient  denies fever, night sweats, weight loss, and fatigue.  Skin:   Patient denies skin rash/ lesion and itching.  Eyes:   Patient denies blurred vision and double vision.  Ears/ Nose/ Throat:   Patient denies sore throat and sinus problems.  Hematologic/Lymphatic:   Patient denies swollen glands and easy bruising.  Cardiovascular:   Patient denies chest pains and leg swelling.   Respiratory:   Patient denies cough and shortness of breath.  Endocrine:   Patient denies excessive thirst.  Musculoskeletal:   Patient denies back pain and joint pain.  Neurological:   Patient denies headaches and dizziness.  Psychologic:   Patient denies depression and anxiety.   VITAL SIGNS:      08/16/2017 01:08 PM  Weight 208 lb / 94.35 kg  Height 70 in / 177.8 cm  BP 155/103 mmHg  Pulse 67 /min  Temperature 98.3 F / 36.8 C  BMI 29.8 kg/m   MULTI-SYSTEM PHYSICAL EXAMINATION:    Constitutional: Well-nourished. No physical deformities. Normally developed. Good grooming.  Respiratory: No labored breathing, no use of accessory muscles. Normal breath sounds.  Cardiovascular: Regular rate and rhythm. No murmur, no gallop. Normal temperature, normal extremity pulses, no swelling, no varicosities.  Skin: No paleness, no jaundice, no cyanosis. No lesion, no ulcer, no rash.  Neurologic / Psychiatric: Oriented to time, oriented to place, oriented to person. No depression, no anxiety, no agitation.  Gastrointestinal: No mass, no tenderness, no rigidity, non obese abdomen. No CVA tenderness noted on exam.   Musculoskeletal: Normal gait and station of head and neck.    PAST DATA REVIEWED:  Source Of History:  Patient  Lab Test Review:   BMP  Records Review:   Previous Patient Records  Urine Test Review:   Urinalysis, Urine Culture  X-Ray Review: C.T. Stone Protocol: Reviewed Films. Reviewed Report.    PROCEDURES:         KUB - [id number]  A single view of the abdomen is obtained. Renal shadows are not well visualized today due to overlying bowel gas pattern. There continues to be 2 opacities along expected anatomical tract of the right ureter, consistent with residual fragments noted on C.T imaging. These range in approximatly 2-3 mm in size. Imagining also consistent with constipation.               Urinalysis w/Scope Dipstick Dipstick Cont'd Micro  Color: Yellow Bilirubin: Neg WBC/hpf:  NS (Not Seen)  Appearance: Clear Ketones: Neg RBC/hpf: 0 - 2/hpf  Specific Gravity: 1.025 Blood: 1+ Bacteria: NS (Not Seen)  pH: <=5.0 Protein: Neg Cystals: NS (Not Seen)  Glucose: Neg Urobilinogen: 0.2 Casts: NS (Not Seen)    Nitrites: Neg Trichomonas: Not Present    Leukocyte Esterase: Neg Mucous: Not Present      Epithelial Cells: NS (Not Seen)      Yeast: NS (Not Seen)      Sperm: Not Present   ASSESSMENT:      ICD-10 Details  1 GU:   Renal calculus - N20.0   2   Flank Pain - R10.84 Stable   PLAN:           Medications New Meds: Tamsulosin Hcl 0.4 mg capsule 1 capsule PO Daily   #30  0 Refill(s)  Hydromorphone Hcl 2 mg tablet 1 tablet PO Q 6 H PRN   #10  0 Refill(s)          Orders Labs BUN, Creatinine and GFR, Urine Culture  X-Rays: KUB  Schedule Return Visit/Planned Activity: 1 Week - Office Visit, Extender             Note: Okay to overbook, if needed          Document Letter(s):  Created for Patient: Clinical Summary        Notes:   I will send urine for culture today. KUB continues to show two 2-3 mm opacities in his right distal ureter, consistent with residual fragments noted on C.T imaging. Treatment options discussed in detail today. Patient would like to try to pass fragments, as long as pain can be managed. He will begin MET. Rx for Tamsulosin sent to his pharmacy. He current has some pain in clinic today, but states this is tolerable. He declined Tordol injection today. We discussed that he try to limit use of narcotic pain medication. I recommended use of Tylenol, Ibuprofen, and warm compress. However, I will provide limited Rx for Hydromorphone (he states Percocet was not helpful) today to use for episodes of severe pain only. Control database reviewed with only recent Rx for pain medication prescribed related to stone event. We discussed that constipation is likely an attributing factor to his pain. I recommended use of fleets enema. We also discussed use of  magnesium citrate, as well. He voiced understanding. Indications and side effects of all medications prescribed reviewed. Given elevation in creatinine, I will reassess labs today. As long as this remains stable, I think it would be reasonable to continue MET. I will plan to have him return for follow up in one week. Return precuations discussed in the interim for fever or progressive pain, nausea, or vomiting.         Next Appointment:      Next Appointment: 08/23/2017 08:30 AM    Appointment Type: Office Visit Established Patient    Location: [facility] [phone]    Provider: Azucena Fallen    Reason for Visit: 1 wk fup/overbook per bree     ** Signed by Azucena Fallen on 08/16/17 at 3:33 PM (EDT)*     The information contained in this medical record document is considered private and confidential patient information. This information can only be used for the medical diagnosis and/or medical services that are being provided by the patient's selected caregivers. This information can only be distributed outside of the patient's care if the patient agrees and signs waivers of authorization for this information to be sent to an outside source or route.  Addendum: I discussed the patient with nurse practitioner Ronal Fear and agree with her assessment and plan. I reviewed the patient's chart, labs and imaging.  He continues to have pain, feel poorly, poor by mouth intake and rising creatinine.  He will be put on for right ureteroscopy.  We gave him the options of rechecking a creatinine and giving him a couple more days but he had not eaten much today and elected to proceed today.

## 2017-08-17 NOTE — Anesthesia Procedure Notes (Addendum)
Procedure Name: LMA Insertion Date/Time: 08/17/2017 8:30 PM Performed by: Lorelee MarketEdathil, Cyra Spader T, CRNA Pre-anesthesia Checklist: Patient identified, Emergency Drugs available, Suction available, Patient being monitored and Timeout performed Patient Re-evaluated:Patient Re-evaluated prior to induction Oxygen Delivery Method: Circle system utilized Preoxygenation: Pre-oxygenation with 100% oxygen Induction Type: IV induction Ventilation: Mask ventilation without difficulty LMA: LMA inserted LMA Size: 5.0 Number of attempts: 1 Dental Injury: Teeth and Oropharynx as per pre-operative assessment

## 2017-08-18 ENCOUNTER — Encounter (HOSPITAL_COMMUNITY): Payer: Self-pay | Admitting: Urology

## 2018-11-22 ENCOUNTER — Other Ambulatory Visit: Payer: Self-pay | Admitting: Chiropractic Medicine

## 2018-11-22 DIAGNOSIS — G8929 Other chronic pain: Secondary | ICD-10-CM

## 2018-12-13 ENCOUNTER — Other Ambulatory Visit: Payer: BLUE CROSS/BLUE SHIELD

## 2019-08-28 ENCOUNTER — Emergency Department (HOSPITAL_COMMUNITY): Payer: BLUE CROSS/BLUE SHIELD

## 2019-08-28 ENCOUNTER — Encounter (HOSPITAL_COMMUNITY): Payer: Self-pay | Admitting: Emergency Medicine

## 2019-08-28 ENCOUNTER — Emergency Department (HOSPITAL_COMMUNITY)
Admission: EM | Admit: 2019-08-28 | Discharge: 2019-08-29 | Disposition: A | Discharge status: Home / Self Care | Payer: BLUE CROSS/BLUE SHIELD | Attending: Emergency Medicine | Admitting: Emergency Medicine

## 2019-08-28 DIAGNOSIS — R0789 Other chest pain: Secondary | ICD-10-CM | POA: Diagnosis not present

## 2019-08-28 DIAGNOSIS — Z5321 Procedure and treatment not carried out due to patient leaving prior to being seen by health care provider: Secondary | ICD-10-CM | POA: Insufficient documentation

## 2019-08-28 LAB — BASIC METABOLIC PANEL
Anion gap: 8 (ref 5–15)
BUN: 16 mg/dL (ref 6–20)
CO2: 28 mmol/L (ref 22–32)
Calcium: 9.3 mg/dL (ref 8.9–10.3)
Chloride: 105 mmol/L (ref 98–111)
Creatinine, Ser: 1.27 mg/dL — ABNORMAL HIGH (ref 0.61–1.24)
GFR calc Af Amer: >60 mL/min (ref >60)
GFR calc non Af Amer: >60 mL/min (ref >60)
Glucose, Bld: 83 mg/dL (ref 70–99)
Potassium: 4.5 mmol/L (ref 3.5–5.1)
Sodium: 141 mmol/L (ref 135–145)

## 2019-08-28 LAB — CBC
HCT: 49.3 % (ref 39.0–52.0)
Hemoglobin: 17.2 g/dL — ABNORMAL HIGH (ref 13.0–17.0)
MCH: 27.8 pg (ref 26.0–34.0)
MCHC: 34.9 g/dL (ref 30.0–36.0)
MCV: 79.6 fL — ABNORMAL LOW (ref 80.0–100.0)
Platelets: 208 10*3/uL (ref 150–400)
RBC: 6.19 MIL/uL — ABNORMAL HIGH (ref 4.22–5.81)
RDW: 14.7 % (ref 11.5–15.5)
WBC: 8.2 10*3/uL (ref 4.0–10.5)
nRBC: 0 % (ref 0.0–0.2)

## 2019-08-28 LAB — TROPONIN I (HIGH SENSITIVITY): Troponin I (High Sensitivity): 3 ng/L (ref <18)

## 2019-08-28 MED ORDER — SODIUM CHLORIDE 0.9% FLUSH: 3.0000 mL | Freq: Once | INTRAVENOUS | Status: DC

## 2019-08-28 NOTE — ED Triage Notes (Signed)
Patient here from work reporting intermittent chest pain that started last night and worse today. Denies n/v.

## 2019-08-29 NOTE — ED Notes (Signed)
Pt called for blood work and updated vitals. No answer x1

## 2019-11-24 IMAGING — CT CT RENAL STONE PROTOCOL
2 of 4 series · 15 of 46 positions shown, 17 images · non-contrast
Comparison: CT of the abdomen and pelvis performed 07/03/2013, and
MRI of the abdomen performed 04/26/2017

CLINICAL DATA: Acute onset of generalized abdominal pain. Lack of
bowel movements. Recent lithotripsy.

EXAM:
CT ABDOMEN AND PELVIS WITHOUT CONTRAST
TECHNIQUE: Multidetector CT imaging of the abdomen and pelvis was performed
following the standard protocol without IV contrast.

[Series 2: axial st · axial · 0.84mm/px · z∈[+1234,+1718]mm · 12 of 109 slices shown, 14 images]
[im 6/109  soft-tissue]
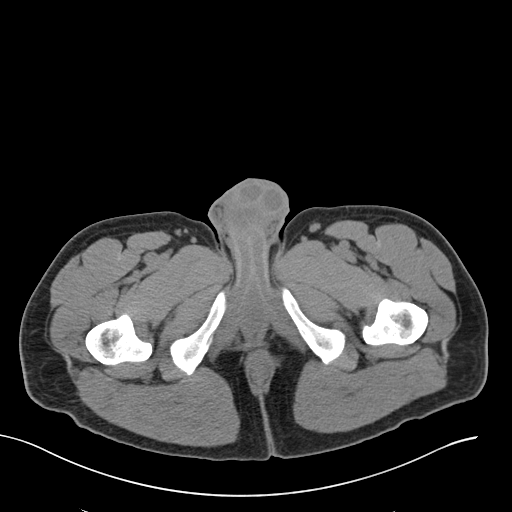
[im 6/109  bone]
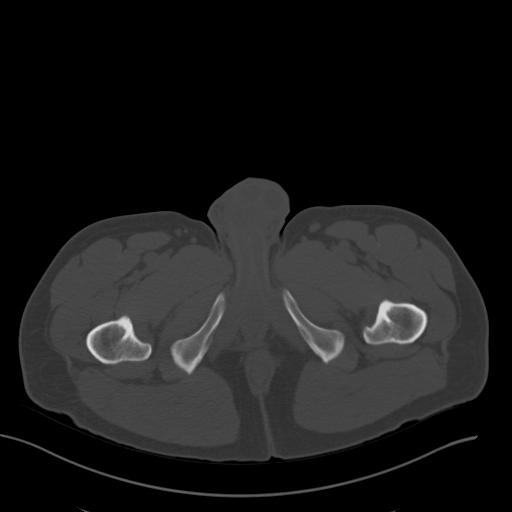
[im 16/109  soft-tissue]
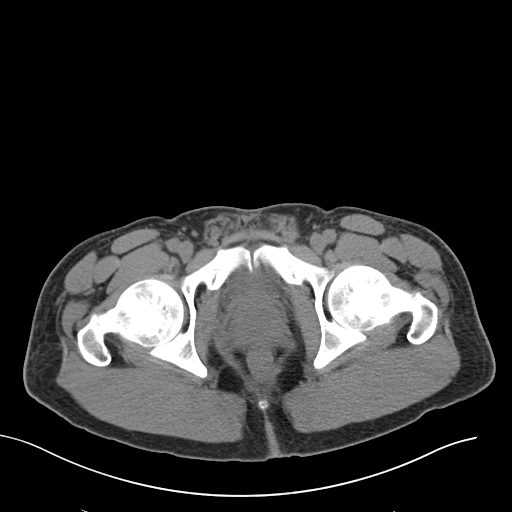
[im 26/109  soft-tissue]
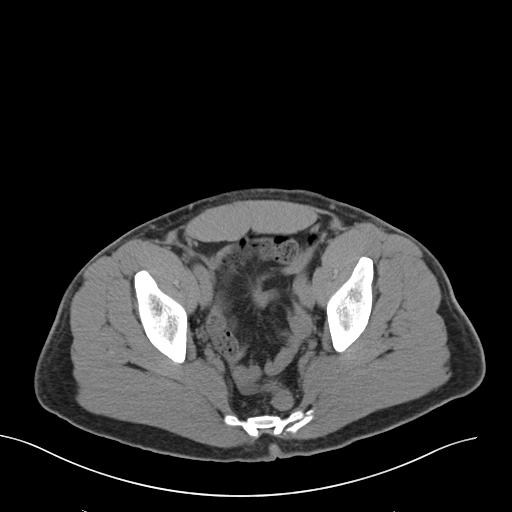
[im 31/109  soft-tissue]
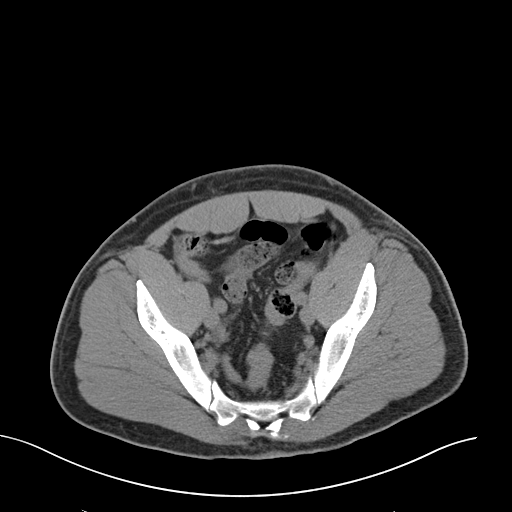
[im 42/109  soft-tissue]
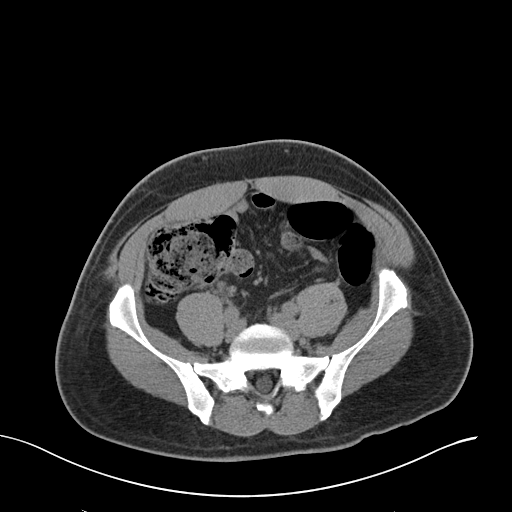
[im 52/109  soft-tissue]
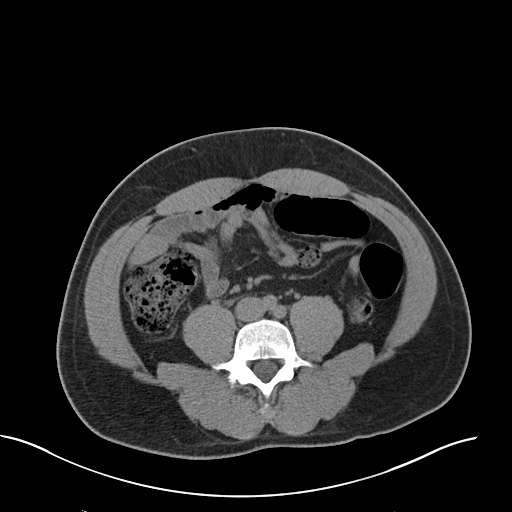
[im 57/109  soft-tissue]
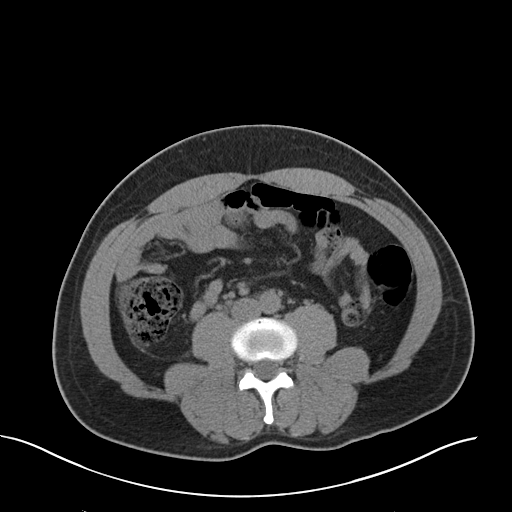
[im 67/109  soft-tissue]
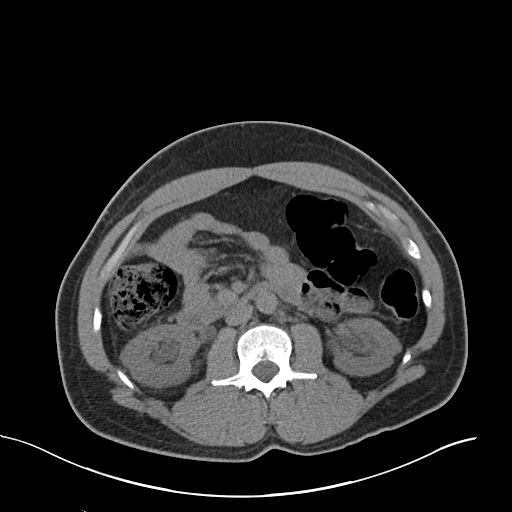
[im 78/109  soft-tissue]
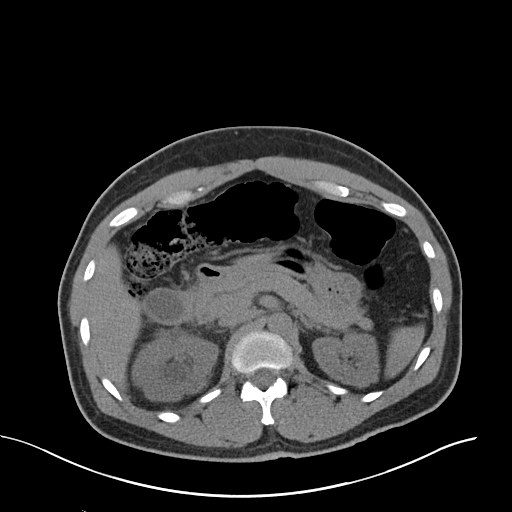
[im 78/109  bone]
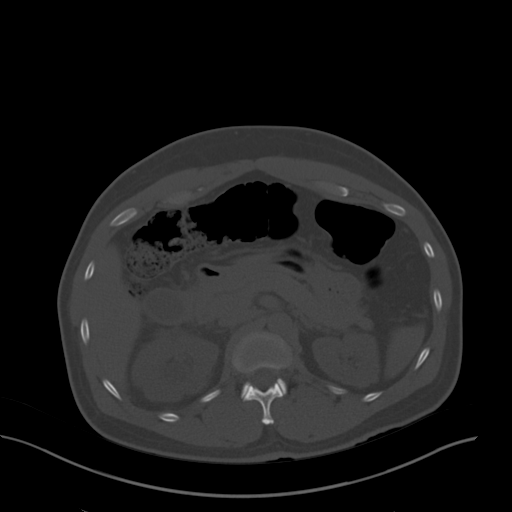
[im 83/109  soft-tissue]
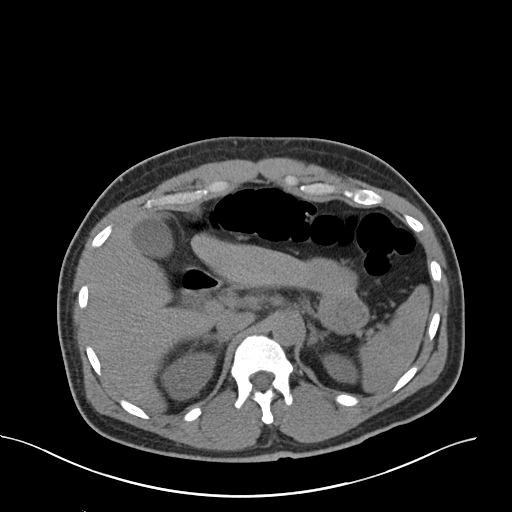
[im 93/109  soft-tissue]
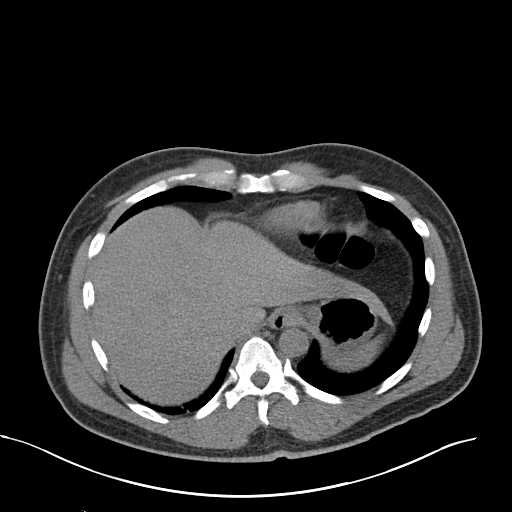
[im 103/109  soft-tissue]
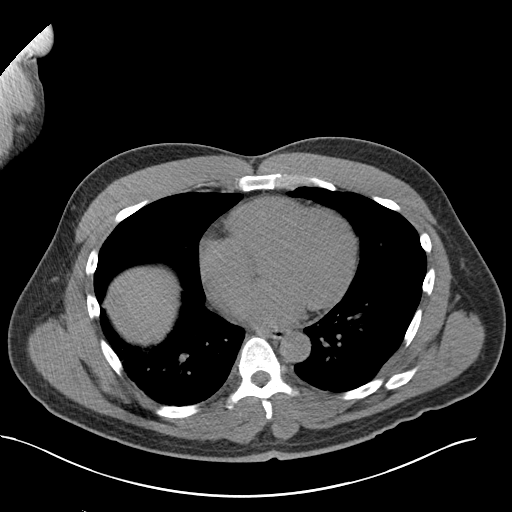

[Series 5: coronal · coronal · 0.70mm/px · 3 of 143 slices shown]
[im 48/143  soft-tissue]
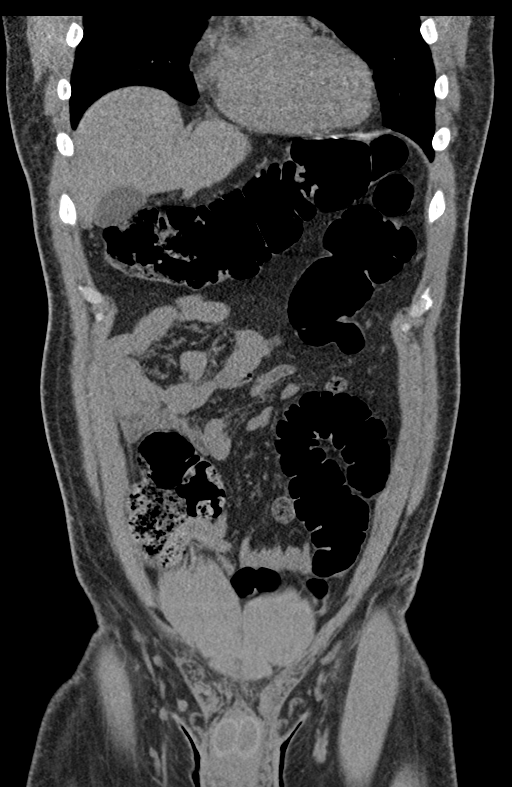
[im 64/143  soft-tissue]
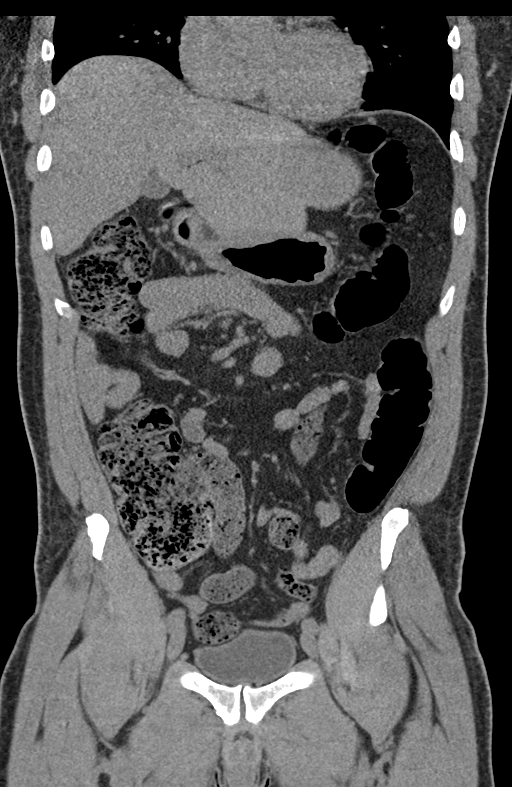
[im 79/143  soft-tissue]
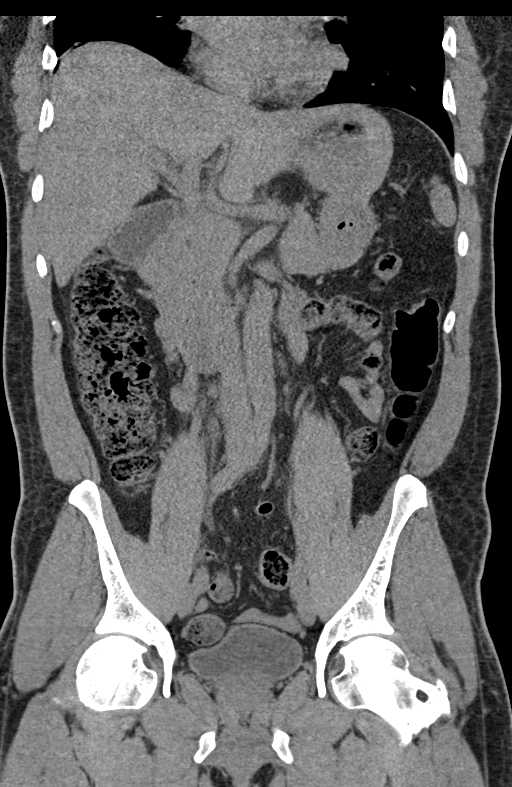

[15 of 46 positions shown; findings below may reference images not displayed]

FINDINGS: Lower chest: Mild right basilar atelectasis or scarring is noted.
The visualized portions of the mediastinum are unremarkable.

Hepatobiliary: The liver is unremarkable in appearance. The
gallbladder is unremarkable in appearance. The common bile duct
remains normal in caliber.

Pancreas: The pancreas is within normal limits.

Spleen: The spleen is unremarkable in appearance.

Adrenals/Urinary Tract: The adrenal glands are unremarkable.

Moderate right-sided hydronephrosis is noted, reflecting obstructing
4 mm and 2 mm stones at the distal right ureter, just above the
right vesicoureteral junction. Right-sided perinephric stranding is
noted. Mild left-sided hydronephrosis appears to reflect a stricture
proximally at the left ureteropelvic junction.

A few tiny nonobstructing right renal stones are seen.

Stomach/Bowel: The stomach is unremarkable in appearance. The small
bowel is within normal limits. The appendix is normal in caliber,
without evidence of appendicitis. The colon is unremarkable in
appearance.

Vascular/Lymphatic: The abdominal aorta is unremarkable in
appearance. The inferior vena cava is grossly unremarkable. No
retroperitoneal lymphadenopathy is seen. No pelvic sidewall
lymphadenopathy is identified.

Reproductive: The bladder is mildly distended and grossly
unremarkable. The prostate is normal in size.

Other: No additional soft tissue abnormalities are seen.

Musculoskeletal: No acute osseous abnormalities are identified. Mild
facet disease is noted at the lower lumbar spine. The visualized
musculature is unremarkable in appearance.
IMPRESSION: 1. Moderate right-sided hydronephrosis, reflecting obstructing 4 mm
and 2 mm stones at the distal right ureter, just above the right
vesicoureteral junction.
2. Mild left-sided hydronephrosis appears to reflect a stricture
proximally at the left ureteropelvic junction.
3. Few tiny nonobstructing right renal stones noted.
4. Mild right basilar atelectasis or scarring noted.

## 2021-12-07 IMAGING — CR DG CHEST 2V
2 series · 2 of 2 positions shown · non-contrast
Comparison: 08/02/2012

CLINICAL DATA: Chest pain

EXAM:
CHEST - 2 VIEW

[w chest pa]
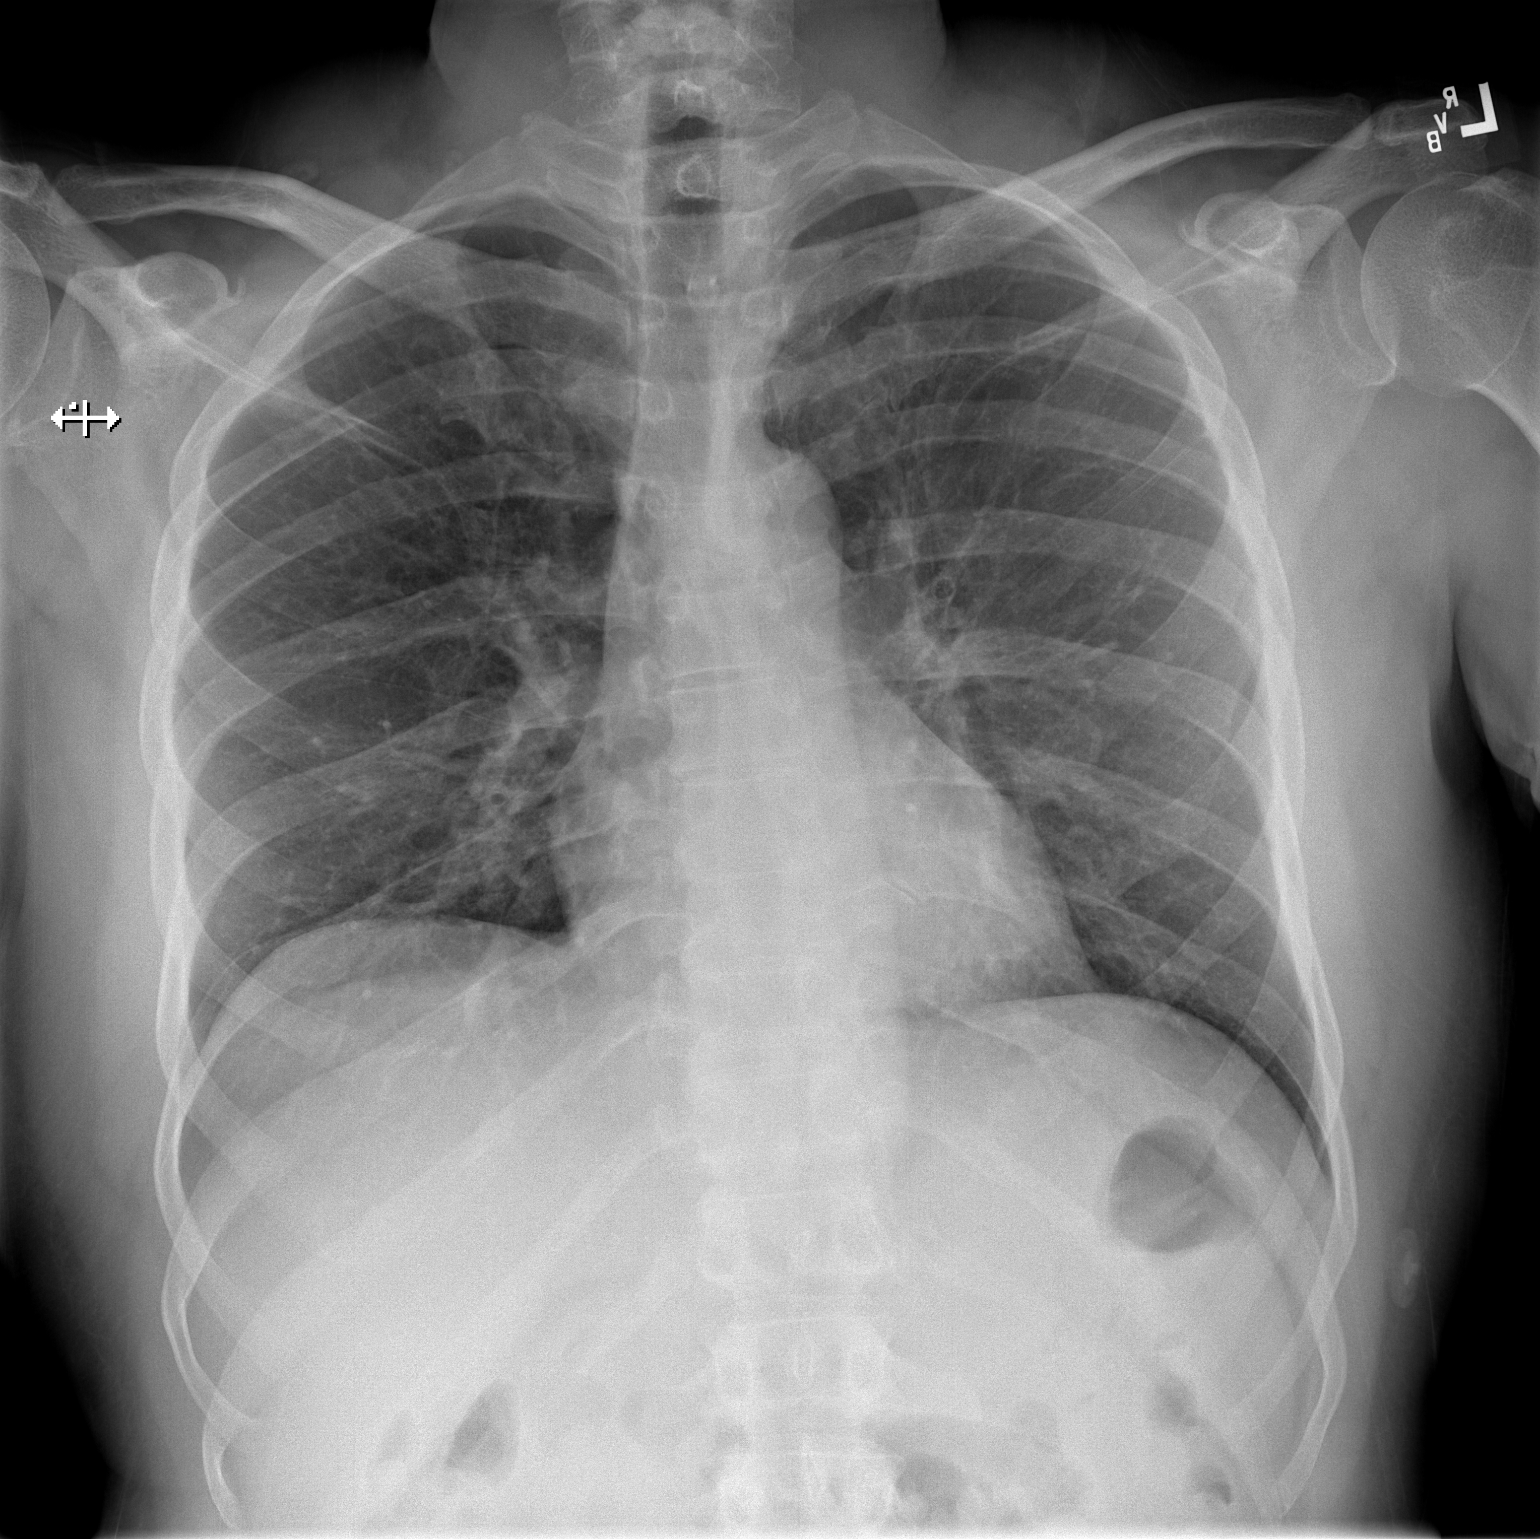

[w chest lat]
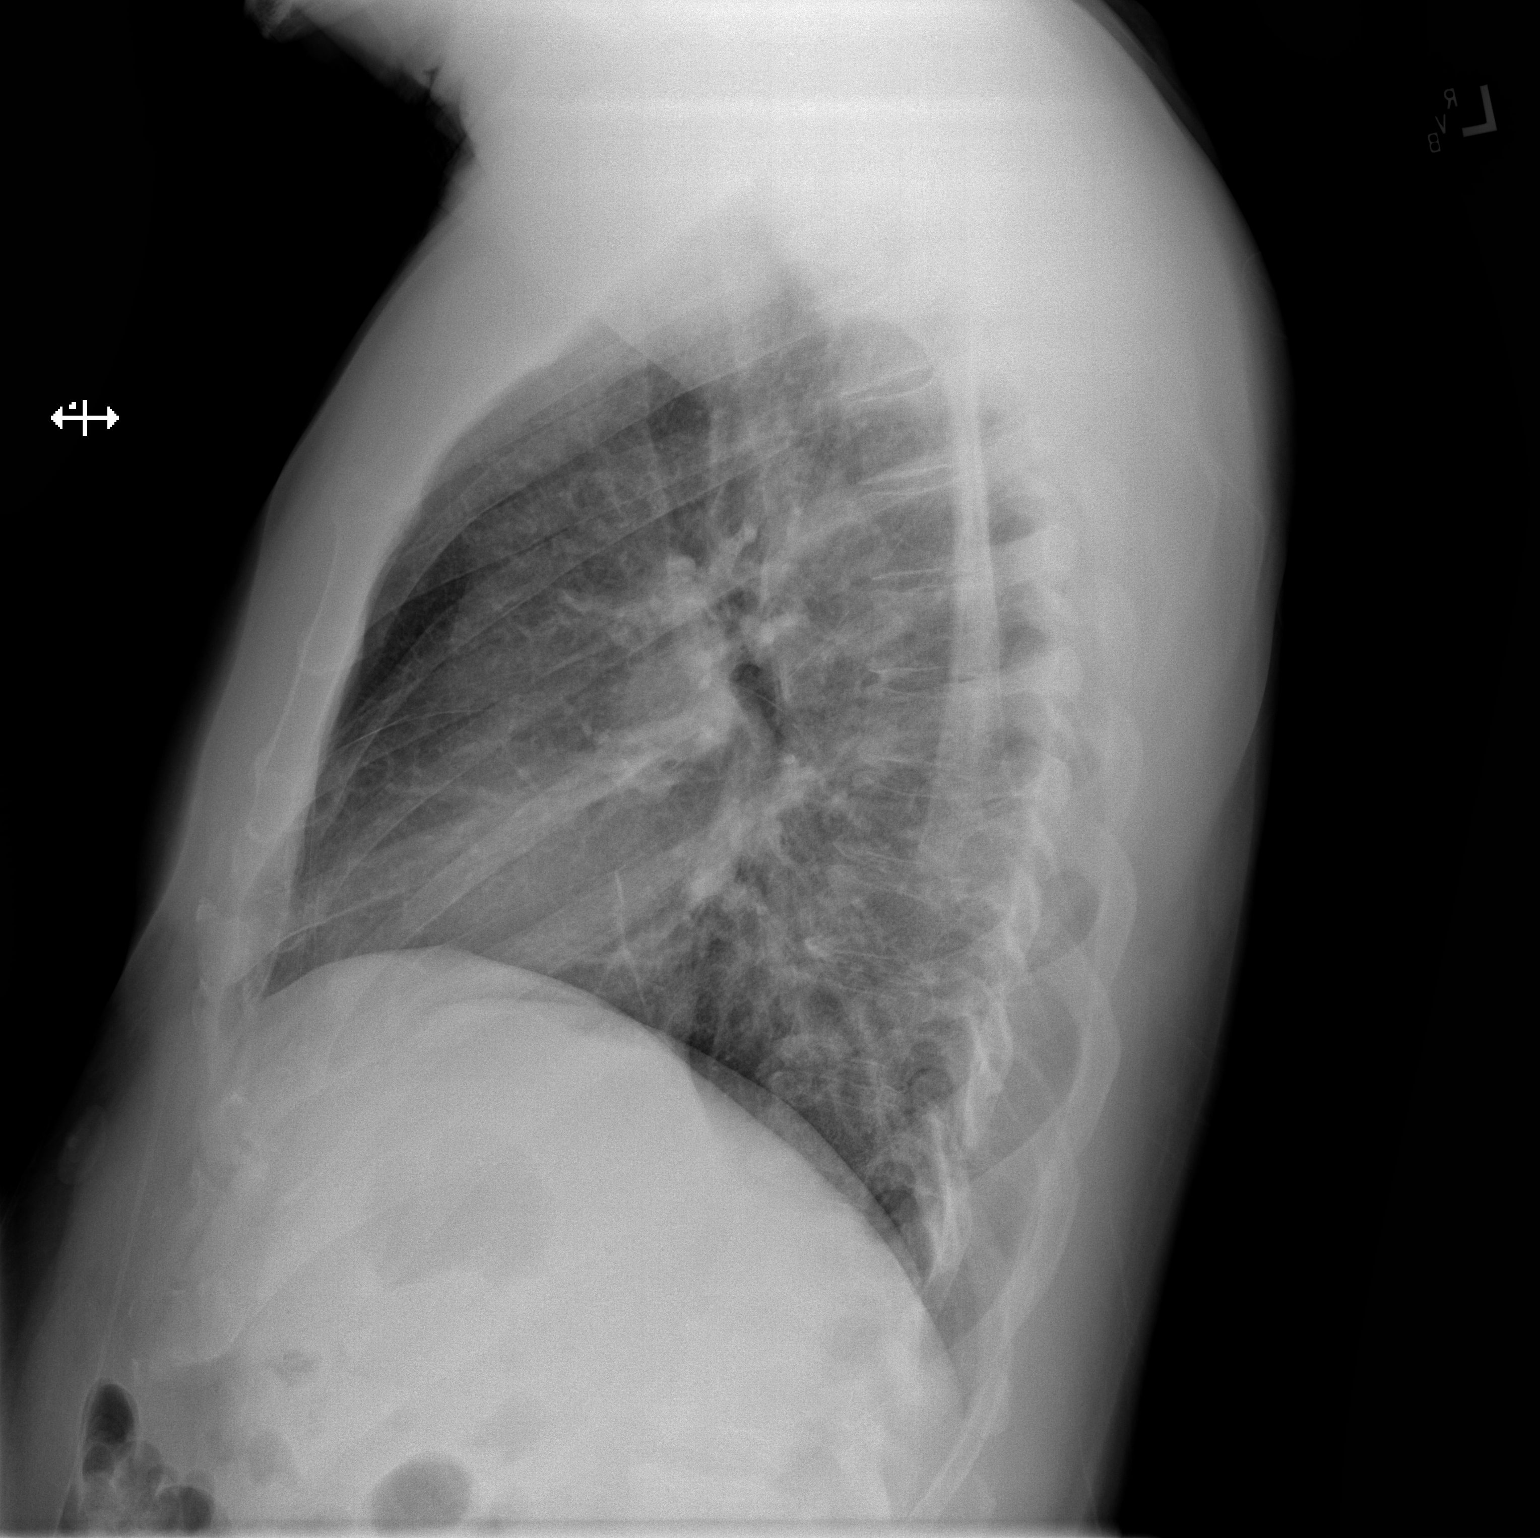

[2 of 2 positions shown; findings below may reference images not displayed]

FINDINGS: The heart size and mediastinal contours are within normal limits.
Both lungs are clear. The visualized skeletal structures are
unremarkable.
IMPRESSION: No active cardiopulmonary disease.
